# Patient Record
Sex: Female | Born: 1975 | Race: Black or African American | Hispanic: No | Marital: Single | Smoking: Never smoker
Health system: Southern US, Community
[De-identification: ages and names within clinical notes are randomized; demographics above are authoritative.]

## PROBLEM LIST (undated history)

## (undated) DIAGNOSIS — H409 Unspecified glaucoma: Secondary | ICD-10-CM

## (undated) DIAGNOSIS — G4730 Sleep apnea, unspecified: Secondary | ICD-10-CM

## (undated) DIAGNOSIS — R32 Unspecified urinary incontinence: Secondary | ICD-10-CM

## (undated) DIAGNOSIS — F32A Depression, unspecified: Secondary | ICD-10-CM

## (undated) DIAGNOSIS — R519 Headache, unspecified: Secondary | ICD-10-CM

## (undated) DIAGNOSIS — B019 Varicella without complication: Secondary | ICD-10-CM

## (undated) DIAGNOSIS — G35 Multiple sclerosis: Secondary | ICD-10-CM

## (undated) HISTORY — DX: Varicella without complication: B01.9

## (undated) HISTORY — PX: PARTIAL HYSTERECTOMY: SHX80

## (undated) HISTORY — DX: Sleep apnea, unspecified: G47.30

## (undated) HISTORY — DX: Headache, unspecified: R51.9

## (undated) HISTORY — DX: Unspecified urinary incontinence: R32

## (undated) HISTORY — DX: Depression, unspecified: F32.A

## (undated) HISTORY — DX: Multiple sclerosis: G35

## (undated) HISTORY — DX: Unspecified glaucoma: H40.9

## (undated) HISTORY — PX: UTERINE FIBROID SURGERY: SHX826

---

## 2016-02-28 DIAGNOSIS — D649 Anemia, unspecified: Secondary | ICD-10-CM | POA: Insufficient documentation

## 2016-02-28 HISTORY — DX: Anemia, unspecified: D64.9

## 2021-02-15 ENCOUNTER — Ambulatory Visit (INDEPENDENT_AMBULATORY_CARE_PROVIDER_SITE_OTHER): Payer: 59 | Admitting: Adult Health

## 2021-02-15 ENCOUNTER — Other Ambulatory Visit: Payer: Self-pay

## 2021-02-15 ENCOUNTER — Encounter: Payer: Self-pay | Admitting: Adult Health

## 2021-02-15 VITALS — BP 124/82 | HR 88 | Temp 97.7°F | Ht 63.39 in | Wt 298.4 lb

## 2021-02-15 DIAGNOSIS — D509 Iron deficiency anemia, unspecified: Secondary | ICD-10-CM

## 2021-02-15 DIAGNOSIS — Z6841 Body Mass Index (BMI) 40.0 and over, adult: Secondary | ICD-10-CM | POA: Insufficient documentation

## 2021-02-15 DIAGNOSIS — E559 Vitamin D deficiency, unspecified: Secondary | ICD-10-CM | POA: Diagnosis not present

## 2021-02-15 DIAGNOSIS — Z23 Encounter for immunization: Secondary | ICD-10-CM | POA: Insufficient documentation

## 2021-02-15 DIAGNOSIS — Z0000 Encounter for general adult medical examination without abnormal findings: Secondary | ICD-10-CM | POA: Insufficient documentation

## 2021-02-15 DIAGNOSIS — Z1231 Encounter for screening mammogram for malignant neoplasm of breast: Secondary | ICD-10-CM

## 2021-02-15 DIAGNOSIS — R5383 Other fatigue: Secondary | ICD-10-CM | POA: Diagnosis not present

## 2021-02-15 LAB — COMPREHENSIVE METABOLIC PANEL
ALT: 8 U/L (ref 0–35)
AST: 10 U/L (ref 0–37)
Albumin: 4 g/dL (ref 3.5–5.2)
Alkaline Phosphatase: 68 U/L (ref 39–117)
BUN: 10 mg/dL (ref 6–23)
CO2: 27 mEq/L (ref 19–32)
Calcium: 9 mg/dL (ref 8.4–10.5)
Chloride: 105 mEq/L (ref 96–112)
Creatinine, Ser: 0.92 mg/dL (ref 0.40–1.20)
GFR: 74.35 mL/min (ref >60.00)
Glucose, Bld: 109 mg/dL — ABNORMAL HIGH (ref 70–99)
Potassium: 4.7 mEq/L (ref 3.5–5.1)
Sodium: 139 mEq/L (ref 135–145)
Total Bilirubin: 0.6 mg/dL (ref 0.2–1.2)
Total Protein: 6.8 g/dL (ref 6.0–8.3)

## 2021-02-15 LAB — CBC WITH DIFFERENTIAL/PLATELET
Basophils Absolute: 0 10*3/uL (ref 0.0–0.1)
Basophils Relative: 0.3 % (ref 0.0–3.0)
Eosinophils Absolute: 0.1 10*3/uL (ref 0.0–0.7)
Eosinophils Relative: 1.4 % (ref 0.0–5.0)
HCT: 39.1 % (ref 36.0–46.0)
Hemoglobin: 12.8 g/dL (ref 12.0–15.0)
Lymphocytes Relative: 12.6 % (ref 12.0–46.0)
Lymphs Abs: 0.6 10*3/uL — ABNORMAL LOW (ref 0.7–4.0)
MCHC: 32.6 g/dL (ref 30.0–36.0)
MCV: 89.1 fl (ref 78.0–100.0)
Monocytes Absolute: 0.3 10*3/uL (ref 0.1–1.0)
Monocytes Relative: 6.6 % (ref 3.0–12.0)
Neutro Abs: 3.8 10*3/uL (ref 1.4–7.7)
Neutrophils Relative %: 79.1 % — ABNORMAL HIGH (ref 43.0–77.0)
Platelets: 327 10*3/uL (ref 150.0–400.0)
RBC: 4.39 Mil/uL (ref 3.87–5.11)
RDW: 13.9 % (ref 11.5–15.5)
WBC: 4.8 10*3/uL (ref 4.0–10.5)

## 2021-02-15 LAB — TSH: TSH: 2.93 u[IU]/mL (ref 0.35–4.50)

## 2021-02-15 LAB — LIPID PANEL
Cholesterol: 242 mg/dL — ABNORMAL HIGH (ref 0–200)
HDL: 34.7 mg/dL — ABNORMAL LOW (ref >39.00)
LDL Cholesterol: 180 mg/dL — ABNORMAL HIGH (ref 0–99)
NonHDL: 207.3
Total CHOL/HDL Ratio: 7
Triglycerides: 137 mg/dL (ref 0.0–149.0)
VLDL: 27.4 mg/dL (ref 0.0–40.0)

## 2021-02-15 LAB — VITAMIN D 25 HYDROXY (VIT D DEFICIENCY, FRACTURES): VITD: 7.99 ng/mL — ABNORMAL LOW (ref 30.00–100.00)

## 2021-02-15 NOTE — Progress Notes (Signed)
New Patient Office Visit  Subjective:  Patient ID: Shelia Chapman, female    DOB: June 19, 1976  Age: 45 y.o. MRN: [id number]  CC:  Chief Complaint  Patient presents with  . New Patient (Initial Visit)    Previous PCP was Gerarda Fraction at [facility]    HPI Shelia Chapman presents for establishment of care. She feels well. She moved here recently.  She is not up to date for her TDAP vaccine information sheet was given and she will return for this at later date.  She reports she is due for a mammogram and would like this ordered today. She denies any new or changing breast concerns. She reports her last mammogram was over a year ago in [place] and was normal. ' She reports due for a PAP smear no previous.   She has never had colonoscopy, she is age 69 dicussed new guidelines age 83 and she will consider this and check with her insurance. She denies any rectal pain, pressure, bleeding or dark colored stools. Denies any blood loss.   Eye exam complete, has glasses.   She is on iron supplement for history of iron deficiency anemia and is taking.  No recent labs per patient.  Patient  denies any fever, body aches,chills, rash, chest pain, shortness of breath, nausea, vomiting, or diarrhea.  Denies dizziness, lightheadedness, pre syncopal or syncopal episodes.    Patient's last menstrual period was 01/21/2021.   Past Medical History:  Diagnosis Date  . Chicken pox   . Depression   . Frequent headaches   . Glaucoma   . Urine incontinence     Past Surgical History:  Procedure Laterality Date  . ABDOMINAL HYSTERECTOMY  2019    Family History  Problem Relation Age of Onset  . Cancer Mother   . Depression Mother   . Diabetes Mother   . Hyperlipidemia Mother   . Hypertension Mother   . Diabetes Sister   . Hypertension Sister     Social History   Socioeconomic History  . Marital status: Single    Spouse name: Not on file  . Number of children: Not  on file  . Years of education: Not on file  . Highest education level: Not on file  Occupational History  . Not on file  Tobacco Use  . Smoking status: Never Smoker  . Smokeless tobacco: Never Used  Substance and Sexual Activity  . Alcohol use: Never  . Drug use: Never  . Sexual activity: Not Currently  Other Topics Concern  . Not on file  Social History Narrative  . Not on file   Social Determinants of Health   Financial Resource Strain: Not on file  Food Insecurity: Not on file  Transportation Needs: Not on file  Physical Activity: Not on file  Stress: Not on file  Social Connections: Not on file  Intimate Partner Violence: Not on file    ROS Review of Systems  Constitutional: Positive for fatigue.  HENT: Negative.   Respiratory: Negative.   Cardiovascular: Negative.   Gastrointestinal: Negative.   Genitourinary: Negative.   Musculoskeletal: Negative.   Skin: Negative.   Neurological: Negative.   Hematological: Negative.   Psychiatric/Behavioral: Negative.     Objective:   Today's Vitals: BP 124/82 (BP Location: Left Arm, Patient Position: Sitting)   Pulse 88   Temp 97.7 F (36.5 C)   Ht 5' 3.39" (1.61 m)   Wt 298 lb 6.4 oz (135.4 kg)   LMP  01/21/2021   SpO2 98%   BMI 52.22 kg/m   Physical Exam Vitals reviewed.  Constitutional:      General: She is not in acute distress.    Appearance: She is well-developed. She is obese. She is not diaphoretic.     Interventions: She is not intubated.    Comments: Patient appers well, not sickly. Speaking in complete sentences. Patient moves on and off of exam table and in room without difficulty. Gait is normal in hall and in room. Patient is oriented to person place time and situation. Patient answers questions appropriately and engages eye contact and verbal dialect with provider.   HENT:     Head: Normocephalic and atraumatic.     Right Ear: External ear normal.     Left Ear: External ear normal.     Nose:  Nose normal.     Mouth/Throat:     Pharynx: No oropharyngeal exudate.  Eyes:     General: Lids are normal. No scleral icterus.       Right eye: No discharge.        Left eye: No discharge.     Conjunctiva/sclera: Conjunctivae normal.     Right eye: Right conjunctiva is not injected. No exudate or hemorrhage.    Left eye: Left conjunctiva is not injected. No exudate or hemorrhage.    Pupils: Pupils are equal, round, and reactive to light.  Neck:     Thyroid: No thyroid mass or thyromegaly.     Vascular: Normal carotid pulses. No carotid bruit, hepatojugular reflux or JVD.     Trachea: Trachea and phonation normal. No tracheal tenderness or tracheal deviation.     Meningeal: Brudzinski's sign and Kernig's sign absent.  Cardiovascular:     Rate and Rhythm: Normal rate and regular rhythm.     Pulses: Normal pulses.          Radial pulses are 2+ on the right side and 2+ on the left side.       Dorsalis pedis pulses are 2+ on the right side and 2+ on the left side.       Posterior tibial pulses are 2+ on the right side and 2+ on the left side.     Heart sounds: Normal heart sounds, S1 normal and S2 normal. Heart sounds not distant. No murmur heard. No friction rub. No gallop.   Pulmonary:     Effort: Pulmonary effort is normal. No tachypnea, bradypnea, accessory muscle usage or respiratory distress. She is not intubated.     Breath sounds: Normal breath sounds. No stridor. No wheezing, rhonchi or rales.  Chest:     Chest wall: No tenderness.  Breasts:     Right: No supraclavicular adenopathy.     Left: No supraclavicular adenopathy.    Abdominal:     General: Bowel sounds are normal. There is no distension or abdominal bruit.     Palpations: Abdomen is soft. There is no shifting dullness, fluid wave, hepatomegaly, splenomegaly, mass or pulsatile mass.     Tenderness: There is no abdominal tenderness. There is no right CVA tenderness, left CVA tenderness, guarding or rebound.      Hernia: No hernia is present.  Musculoskeletal:        General: No tenderness or deformity. Normal range of motion.     Cervical back: Full passive range of motion without pain, normal range of motion and neck supple. No edema, erythema or rigidity. No spinous process tenderness or muscular tenderness. Normal range  of motion.     Right lower leg: No edema.     Left lower leg: No edema.  Lymphadenopathy:     Head:     Right side of head: No submental, submandibular, tonsillar, preauricular, posterior auricular or occipital adenopathy.     Left side of head: No submental, submandibular, tonsillar, preauricular, posterior auricular or occipital adenopathy.     Cervical: No cervical adenopathy.     Right cervical: No superficial, deep or posterior cervical adenopathy.    Left cervical: No superficial, deep or posterior cervical adenopathy.     Upper Body:     Right upper body: No supraclavicular or pectoral adenopathy.     Left upper body: No supraclavicular or pectoral adenopathy.  Skin:    General: Skin is warm and dry.     Coloration: Skin is not pale.     Findings: No abrasion, bruising, burn, ecchymosis, erythema, lesion, petechiae or rash.     Nails: There is no clubbing.  Neurological:     Mental Status: She is alert and oriented to person, place, and time.     GCS: GCS eye subscore is 4. GCS verbal subscore is 5. GCS motor subscore is 6.     Cranial Nerves: No cranial nerve deficit.     Sensory: No sensory deficit.     Motor: No weakness, tremor, atrophy, abnormal muscle tone or seizure activity.     Coordination: Coordination normal.     Gait: Gait normal.     Deep Tendon Reflexes: Reflexes are normal and symmetric. Reflexes normal. Babinski sign absent on the right side. Babinski sign absent on the left side.     Reflex Scores:      Tricep reflexes are 2+ on the right side and 2+ on the left side.      Bicep reflexes are 2+ on the right side and 2+ on the left side.       Brachioradialis reflexes are 2+ on the right side and 2+ on the left side.      Patellar reflexes are 2+ on the right side and 2+ on the left side.      Achilles reflexes are 2+ on the right side and 2+ on the left side. Psychiatric:        Mood and Affect: Mood normal.        Speech: Speech normal.        Behavior: Behavior normal.        Thought Content: Thought content normal.        Judgment: Judgment normal.     Assessment & Plan:   Problem List Items Addressed This Visit      Other   Vitamin D deficiency   Relevant Orders   VITAMIN D 25 Hydroxy (Vit-D Deficiency, Fractures)   Need for diphtheria-tetanus-pertussis (Tdap) vaccine   Screening mammogram for breast cancer   Relevant Orders   MM Digital Screening   Iron deficiency anemia   Relevant Medications   FEROSUL 325 (65 Fe) MG tablet   Other Relevant Orders   Iron, TIBC and Ferritin Panel   Fatigue   Relevant Orders   CBC with Differential/Platelet   Comprehensive metabolic panel   TSH   Lipid panel   Class 3 severe obesity without serious comorbidity with body mass index (BMI) of 50.0 to 59.9 in adult [facility]) - Primary     Previous records requested.   1. Class 3 severe obesity without serious comorbidity with body mass index (BMI)  of 50.0 to 59.9 in adult, unspecified obesity type ([place]) The patient is advised to begin progressive daily aerobic exercise program, follow a low fat, low cholesterol diet, attempt to lose weight, reduce salt in diet and cooking, reduce exposure to stress, improve dietary compliance, continue current medications, continue current healthy lifestyle patterns and return for routine annual checkups.  Return for PAP and yearly physical   2. Vitamin D deficiency  - VITAMIN D 25 Hydroxy (Vit-D Deficiency, Fractures)  3. Screening mammogram for breast cancer Call [facility] to schedule, she has an appointment.  - MM Digital Screening  4. Need for diphtheria-tetanus-pertussis (Tdap)  vaccine She will review vaccine administration sheet and return to office for this, not given today. Denies any vaccine reaction in past.    5. Iron deficiency anemia, unspecified iron deficiency anemia type- history of and is taking iron supplement.  Taking iron see medication list.   - Iron, TIBC and Ferritin Panel  6. Fatigue, unspecified type Labs today were ordered.  - CBC with Differential/Platelet - Comprehensive metabolic panel - TSH - Lipid panel  Orders Placed This Encounter  Procedures  . MM Digital Screening  . CBC with Differential/Platelet  . Comprehensive metabolic panel  . TSH  . Lipid panel  . VITAMIN D 25 Hydroxy (Vit-D Deficiency, Fractures)  . Iron, TIBC and Ferritin Panel   Outpatient Encounter Medications as of 02/15/2021  Medication Sig  . CRYSELLE-28 0.3-30 MG-MCG tablet Take 1 tablet by mouth daily.  . FEROSUL 325 (65 Fe) MG tablet Take 325 mg by mouth daily.  Marland Kitchen GILENYA 0.5 MG CAPS Take 1 capsule by mouth daily.  Marland Kitchen LUMIGAN 0.01 % SOLN 1 drop at bedtime.   No facility-administered encounter medications on file as of 02/15/2021.    Return precautions given. Red Flags discussed. The patient was given clear instructions to go to ER or return to medical center if any red flags develop, symptoms do not improve, worsen or new problems develop. They verbalized understanding.    Risks, benefits, and alternatives of the medications and treatment plan prescribed today were discussed, and patient expressed understanding.    Education regarding symptom management and diagnosis given to patient on AVS.  Patient was in agreement with treatment plan.   Continue to follow with  Kelby Aline. Juliani Laduke AGNP-C, FNP-C for routine health maintenance.   Kelby Aline. Xareni Kelch AGNP-C, FNP-C Follow-up: Return if symptoms worsen or fail to improve, for at any time for any worsening symptoms.   Marcille Buffy, FNP

## 2021-02-15 NOTE — Patient Instructions (Addendum)
Call to schedule your screening mammogram. Your orders have been placed for your exam.  Let our office know if you have questions, concerns, or any difficulty scheduling.  If normal results then yearly screening mammograms are recommended unless you notice  Changes in your breast then you should schedule a follow up office visit. If abnormal results  Further imaging will be warranted and sooner follow up as determined by the radiologist at the [facility].   [facility] [address], [place] [address]  Main: [phone]   Health Maintenance, Female Adopting a healthy lifestyle and getting preventive care are important in promoting health and wellness. Ask your health care provider about:  The right schedule for you to have regular tests and exams.  Things you can do on your own to prevent diseases and keep yourself healthy. What should I know about diet, weight, and exercise? Eat a healthy diet  Eat a diet that includes plenty of vegetables, fruits, low-fat dairy products, and lean protein.  Do not eat a lot of foods that are high in solid fats, added sugars, or sodium.   Maintain a healthy weight Body mass index (BMI) is used to identify weight problems. It estimates body fat based on height and weight. Your health care provider can help determine your BMI and help you achieve or maintain a healthy weight. Get regular exercise Get regular exercise. This is one of the most important things you can do for your health. Most adults should:  Exercise for at least 150 minutes each week. The exercise should increase your heart rate and make you sweat (moderate-intensity exercise).  Do strengthening exercises at least twice a week. This is in addition to the moderate-intensity exercise.  Spend less time sitting. Even light physical activity can be beneficial. Watch cholesterol and blood lipids Have your blood tested for lipids and  cholesterol at 47 years of age, then have this test every 5 years. Have your cholesterol levels checked more often if:  Your lipid or cholesterol levels are high.  You are older than 47 years of age.  You are at high risk for heart disease. What should I know about cancer screening? Depending on your health history and family history, you may need to have cancer screening at various ages. This may include screening for:  Breast cancer.  Cervical cancer.  Colorectal cancer.  Skin cancer.  Lung cancer. What should I know about heart disease, diabetes, and high blood pressure? Blood pressure and heart disease  High blood pressure causes heart disease and increases the risk of stroke. This is more likely to develop in people who have high blood pressure readings, are of African descent, or are overweight.  Have your blood pressure checked: ? Every 3-5 years if you are 40-55 years of age. ? Every year if you are 37 years old or older. Diabetes Have regular diabetes screenings. This checks your fasting blood sugar level. Have the screening done:  Once every three years after age 27 if you are at a normal weight and have a low risk for diabetes.  More often and at a younger age if you are overweight or have a high risk for diabetes. What should I know about preventing infection? Hepatitis B If you have a higher risk for hepatitis B, you should be screened for this virus. Talk with your health care provider to find out if you are at risk for hepatitis B infection. Hepatitis C Testing  is recommended for:  Everyone born from [birth year] through 1967.  Anyone with known risk factors for hepatitis C. Sexually transmitted infections (STIs)  Get screened for STIs, including gonorrhea and chlamydia, if: ? You are sexually active and are younger than 47 years of age. ? You are older than 47 years of age and your health care provider tells you that you are at risk for this type of  infection. ? Your sexual activity has changed since you were last screened, and you are at increased risk for chlamydia or gonorrhea. Ask your health care provider if you are at risk.  Ask your health care provider about whether you are at high risk for HIV. Your health care provider may recommend a prescription medicine to help prevent HIV infection. If you choose to take medicine to prevent HIV, you should first get tested for HIV. You should then be tested every 3 months for as long as you are taking the medicine. Pregnancy  If you are about to stop having your period (premenopausal) and you may become pregnant, seek counseling before you get pregnant.  Take 400 to 800 micrograms (mcg) of folic acid every day if you become pregnant.  Ask for birth control (contraception) if you want to prevent pregnancy. Osteoporosis and menopause Osteoporosis is a disease in which the bones lose minerals and strength with aging. This can result in bone fractures. If you are 13 years old or older, or if you are at risk for osteoporosis and fractures, ask your health care provider if you should:  Be screened for bone loss.  Take a calcium or vitamin D supplement to lower your risk of fractures.  Be given hormone replacement therapy (HRT) to treat symptoms of menopause. Follow these instructions at home: Lifestyle  Do not use any products that contain nicotine or tobacco, such as cigarettes, e-cigarettes, and chewing tobacco. If you need help quitting, ask your health care provider.  Do not use street drugs.  Do not share needles.  Ask your health care provider for help if you need support or information about quitting drugs. Alcohol use  Do not drink alcohol if: ? Your health care provider tells you not to drink. ? You are pregnant, may be pregnant, or are planning to become pregnant.  If you drink alcohol: ? Limit how much you use to 0-1 drink a day. ? Limit intake if you are  breastfeeding.  Be aware of how much alcohol is in your drink. In the [place] one drink equals one 12 oz bottle of beer (355 mL), one 5 oz glass of wine (148 mL), or one 1 oz glass of hard liquor (44 mL). General instructions  Schedule regular health, dental, and eye exams.  Stay current with your vaccines.  Tell your health care provider if: ? You often feel depressed. ? You have ever been abused or do not feel safe at home ?  ?  ?  ?  ?  ? . Summary  Adopting a healthy lifestyle and getting preventive care are important in promoting health and wellness.  Follow your health care provider's instructions about healthy diet, exercising, and getting tested or screened for diseases.  Follow your health care provider's instructions on monitoring your cholesterol and blood pressure. This information is not intended to replace advice given to you by your health care provider. Make sure you discuss any questions you have with your health care provider. Document Revised: 09/23/2018 Document Reviewed: 09/23/2018 Elsevier Patient Education  2021  [place]. Tdap (Tetanus, Diphtheria, Pertussis) Vaccine: What You Need to Know 1. Why get vaccinated? Tdap vaccine can prevent tetanus, diphtheria, and pertussis. Diphtheria and pertussis spread from person to person. Tetanus enters the body through cuts or wounds.  TETANUS (T) causes painful stiffening of the muscles. Tetanus can lead to serious health problems, including being unable to open the mouth, having trouble swallowing and breathing, or death.  DIPHTHERIA (D) can lead to difficulty breathing, heart failure, paralysis, or death.  PERTUSSIS (aP), also known as "whooping cough," can cause uncontrollable, violent coughing that makes it hard to breathe, eat, or drink. Pertussis can be extremely serious especially in babies and young children, causing pneumonia, convulsions, brain damage, or death. In teens and adults, it can cause weight  loss, loss of bladder control, passing out, and rib fractures from severe coughing. 2. Tdap vaccine Tdap is only for children 7 years and older, adolescents, and adults.  Adolescents should receive a single dose of Tdap, preferably at age 85 or 5 years. Pregnant people should get a dose of Tdap during every pregnancy, preferably during the early part of the third trimester, to help protect the newborn from pertussis. Infants are most at risk for severe, life-threatening complications from pertussis. Adults who have never received Tdap should get a dose of Tdap. Also, adults should receive a booster dose of either Tdap or Td (a different vaccine that protects against tetanus and diphtheria but not pertussis) every 10 years, or after 5 years in the case of a severe or dirty wound or burn. Tdap may be given at the same time as other vaccines. 3. Talk with your health care provider Tell your vaccine provider if the person getting the vaccine:  Has had an allergic reaction after a previous dose of any vaccine that protects against tetanus, diphtheria, or pertussis, or has any severe, life-threatening allergies  Has had a coma, decreased level of consciousness, or prolonged seizures within 7 days after a previous dose of any pertussis vaccine (DTP, DTaP, or Tdap)  Has seizures or another nervous system problem  Has ever had Guillain-Barr Syndrome (also called "GBS")  Has had severe pain or swelling after a previous dose of any vaccine that protects against tetanus or diphtheria In some cases, your health care provider may decide to postpone Tdap vaccination until a future visit. People with minor illnesses, such as a cold, may be vaccinated. People who are moderately or severely ill should usually wait until they recover before getting Tdap vaccine.  Your health care provider can give you more information. 4. Risks of a vaccine reaction  Pain, redness, or swelling where the shot was given, mild  fever, headache, feeling tired, and nausea, vomiting, diarrhea, or stomachache sometimes happen after Tdap vaccination. People sometimes faint after medical procedures, including vaccination. Tell your provider if you feel dizzy or have vision changes or ringing in the ears.  As with any medicine, there is a very remote chance of a vaccine causing a severe allergic reaction, other serious injury, or death. 5. What if there is a serious problem? An allergic reaction could occur after the vaccinated person leaves the clinic. If you see signs of a severe allergic reaction (hives, swelling of the face and throat, difficulty breathing, a fast heartbeat, dizziness, or weakness), call 9-1-1 and get the person to the nearest hospital. For other signs that concern you, call your health care provider.  Adverse reactions should be reported to the Vaccine Adverse Event Reporting System (  VAERS). Your health care provider will usually file this report, or you can do it yourself. Visit the VAERS website at www.vaers.SamedayNews.es or call 434-824-7772. VAERS is only for reporting reactions, and VAERS staff members do not give medical advice. 6. The National Vaccine Injury Compensation Program The [employer] Vaccine Injury Compensation Program (VICP) is a federal program that was created to compensate people who may have been injured by certain vaccines. Claims regarding alleged injury or death due to vaccination have a time limit for filing, which may be as short as two years. Visit the VICP website at GoldCloset.com.ee or call 661-783-6662 to learn about the program and about filing a claim. 7. How can I learn more?  Ask your health care provider.  Call your local or state health department.  Visit the website of the Food and Drug Administration (FDA) for vaccine package inserts and additional information at TraderRating.uy.  Contact the Centers for Disease Control and  Prevention (CDC): ? Call 762 195 4899 (1-800-CDC-INFO) or ? Visit CDC's website at http://hunter.com/. Vaccine Information Statement Tdap (Tetanus, Diphtheria, Pertussis) Vaccine (05/19/2020) This information is not intended to replace advice given to you by your health care provider. Make sure you discuss any questions you have with your health care provider. Document Revised: 06/14/2020 Document Reviewed: 06/14/2020 Elsevier Patient Education  2021 [employer].

## 2021-02-16 LAB — IRON,TIBC AND FERRITIN PANEL
%SAT: 19 % (calc) (ref 16–45)
Ferritin: 15 ng/mL — ABNORMAL LOW (ref 16–232)
Iron: 71 ug/dL (ref 40–190)
TIBC: 365 mcg/dL (calc) (ref 250–450)

## 2021-02-20 ENCOUNTER — Other Ambulatory Visit: Payer: Self-pay | Admitting: Adult Health

## 2021-02-20 DIAGNOSIS — E559 Vitamin D deficiency, unspecified: Secondary | ICD-10-CM

## 2021-02-20 DIAGNOSIS — Z1231 Encounter for screening mammogram for malignant neoplasm of breast: Secondary | ICD-10-CM

## 2021-02-20 MED ORDER — VITAMIN D (ERGOCALCIFEROL) 1.25 MG (50000 UNIT) PO CAPS: 50000.0000 [IU] | ORAL_CAPSULE | ORAL | 0 refills | Status: DC

## 2021-02-20 NOTE — Progress Notes (Signed)
CBC, CMP, and Iron TIBC ferritin within normal variant ranges. Glucose is elevated ,monitor diet and exercise to prevent diabetes in future.  Total cholesterol and LDL elevated.  Discuss lifestyle modification with patient e.g. increase exercise, fiber, fruits, vegetables, lean meat, and omega 3/fish intake and decrease saturated fat.  If patient following strict diet and exercise program already please schedule follow up appointment with primary care physician  Vitamin  D is very low, this can contribute to poor sleep and fatigue, will send in prescription for Vitamin D at 50,000 units by mouth once every 7 days/(once weekly) for 12 weeks. Advise recheck lab Vitamin D in 1-2 weeks after completing vitamin d prescription. Labs need to be scheduled.   TSH for thyroid is within normal limits.

## 2021-02-21 ENCOUNTER — Other Ambulatory Visit: Payer: Self-pay | Admitting: Adult Health

## 2021-02-21 ENCOUNTER — Encounter: Payer: Self-pay | Admitting: Adult Health

## 2021-02-21 DIAGNOSIS — D649 Anemia, unspecified: Secondary | ICD-10-CM

## 2021-02-21 NOTE — Progress Notes (Signed)
Orders Placed This Encounter  Procedures  . Ambulatory referral to Hematology / Oncology  Anemia of unknown etiology - Plan: Ambulatory referral to Hematology / Oncology   Previously seen in [place] per chart review records seen at hematology/ oncology in [place]. [phone].

## 2021-02-28 ENCOUNTER — Other Ambulatory Visit: Payer: Self-pay

## 2021-02-28 ENCOUNTER — Inpatient Hospital Stay
Admission: RE | Admit: 2021-02-28 | Discharge: 2021-02-28 | Disposition: A | Discharge status: Home / Self Care | Payer: Self-pay | Source: Ambulatory Visit | Attending: *Deleted | Admitting: *Deleted

## 2021-02-28 ENCOUNTER — Ambulatory Visit
Admission: RE | Admit: 2021-02-28 | Discharge: 2021-02-28 | Disposition: A | Discharge status: Home / Self Care | Payer: 59 | Source: Ambulatory Visit | Attending: Adult Health | Admitting: Adult Health

## 2021-02-28 ENCOUNTER — Other Ambulatory Visit: Payer: Self-pay | Admitting: *Deleted

## 2021-02-28 DIAGNOSIS — Z1231 Encounter for screening mammogram for malignant neoplasm of breast: Secondary | ICD-10-CM

## 2021-02-28 IMAGING — MG MM DIGITAL SCREENING BILAT W/ TOMO AND CAD
8 of 15 series · 8 of 40 positions shown · non-contrast
Comparison: Previous exam(s).

ACR Breast Density Category a: The breast tissue is almost entirely
fatty.

CLINICAL DATA: Screening.

EXAM:
DIGITAL SCREENING BILATERAL MAMMOGRAM WITH TOMOSYNTHESIS AND CAD
TECHNIQUE: Bilateral screening digital craniocaudal and mediolateral oblique
mammograms were obtained. Bilateral screening digital breast
tomosynthesis was performed. The images were evaluated with
computer-aided detection.

[R CV synth-2D]
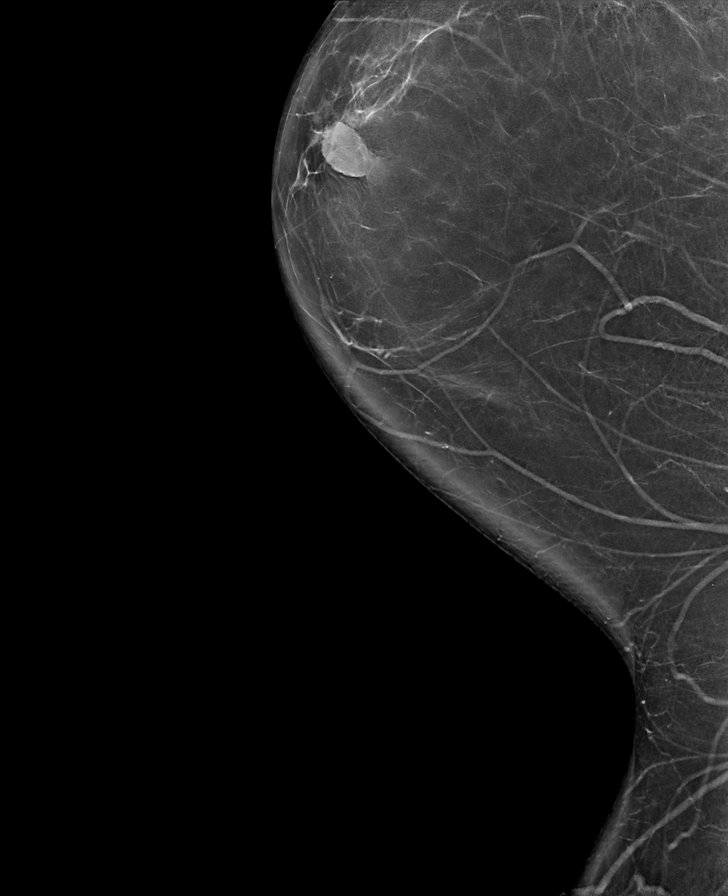

[R MLO synth-2D]
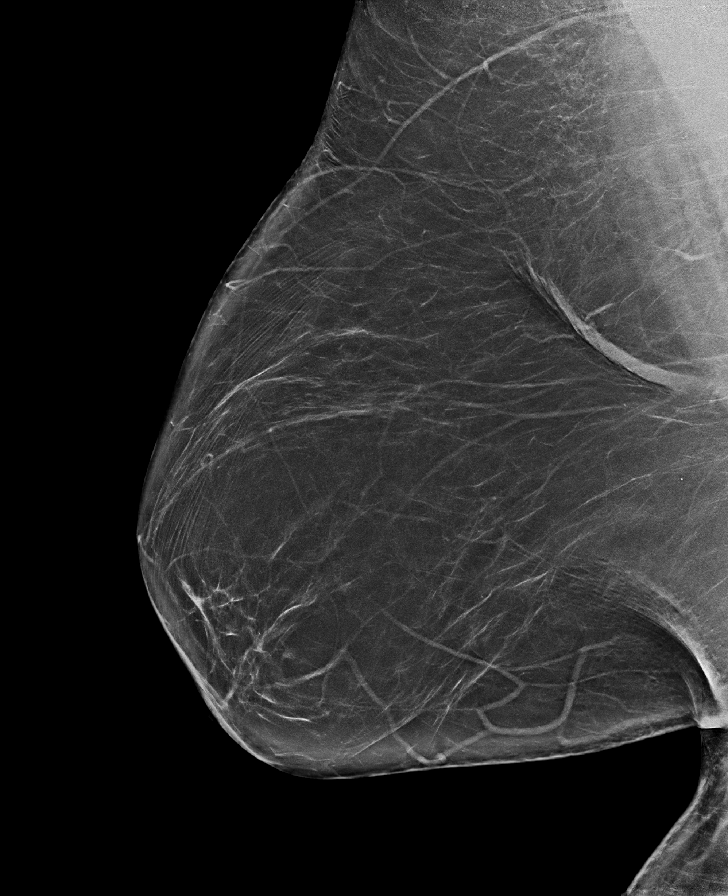

[L MLO synth-2D (1 of 2)]
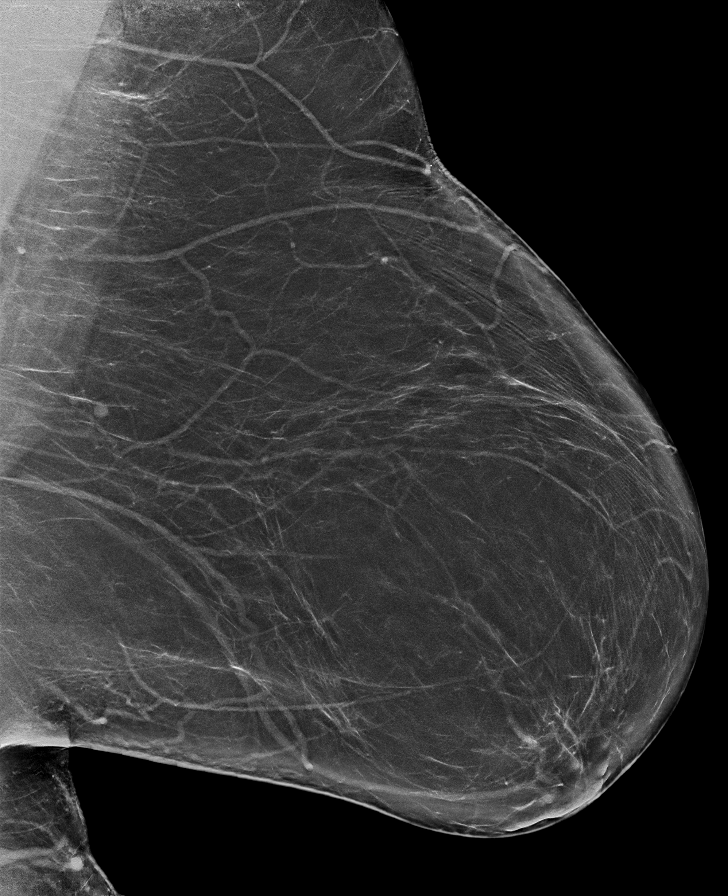

[L CV synth-2D]
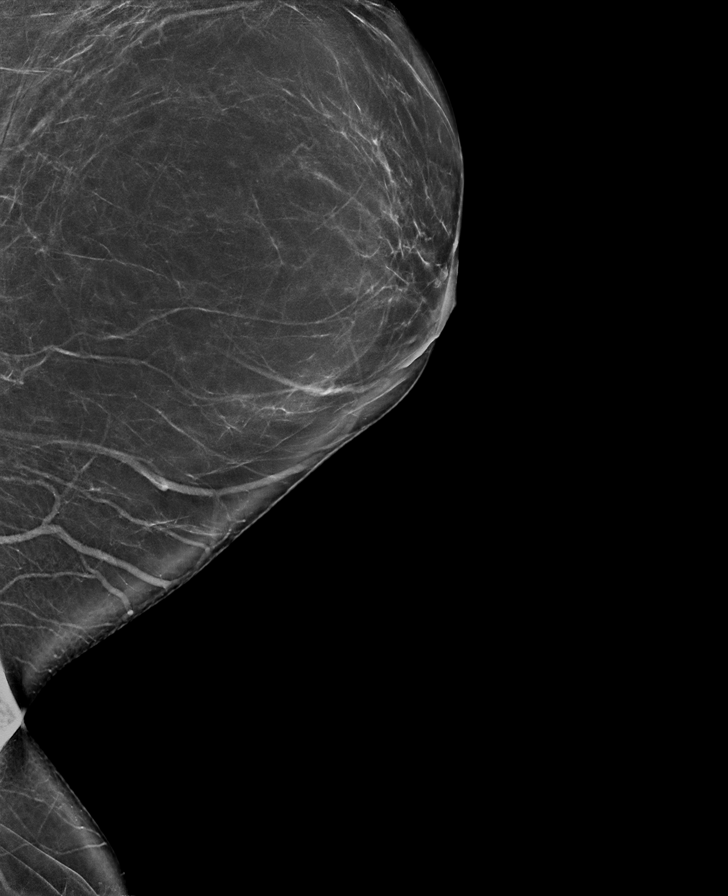

[L CC synth-2D]
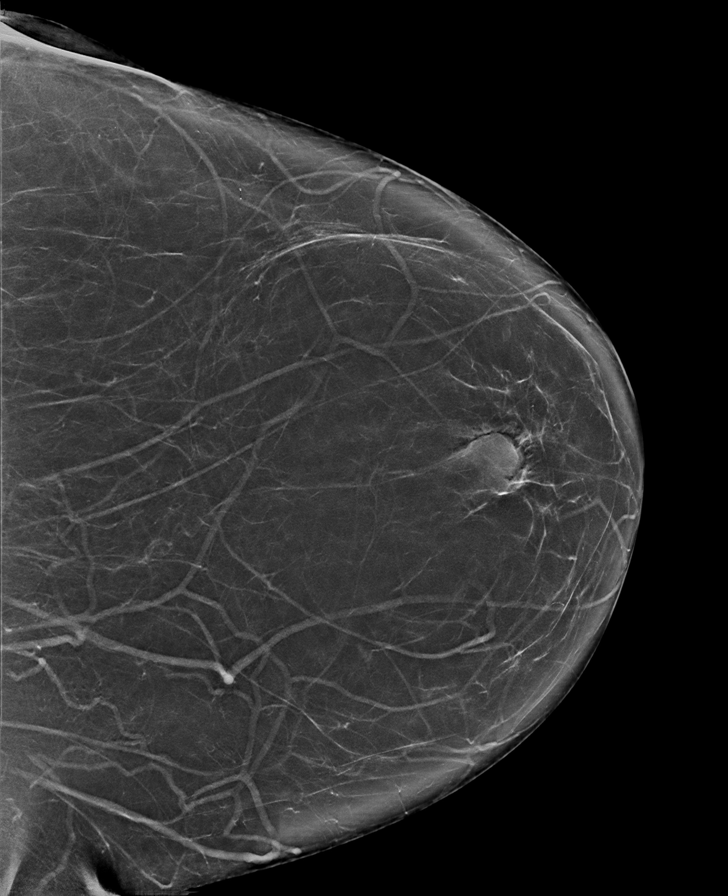

[L MLO synth-2D (2 of 2)]
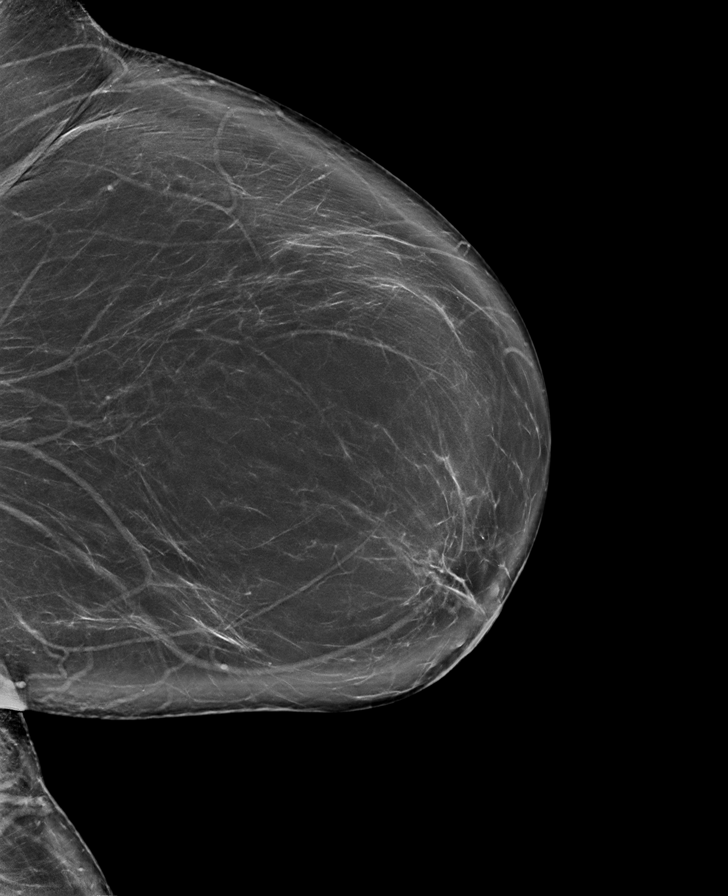

[R CC synth-2D]
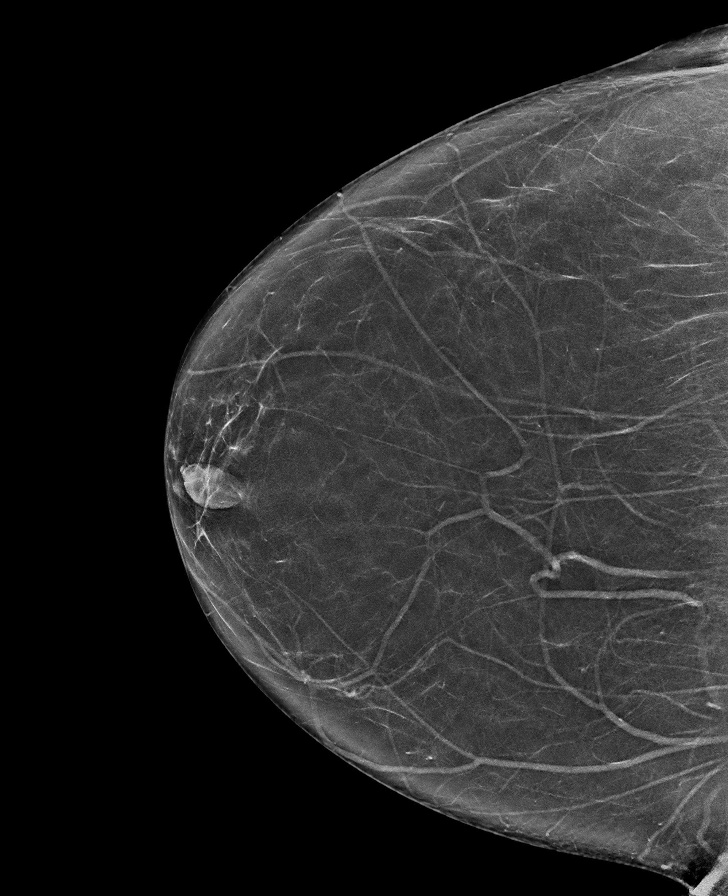

[R CV tomo · tomo slice 61/89.0]
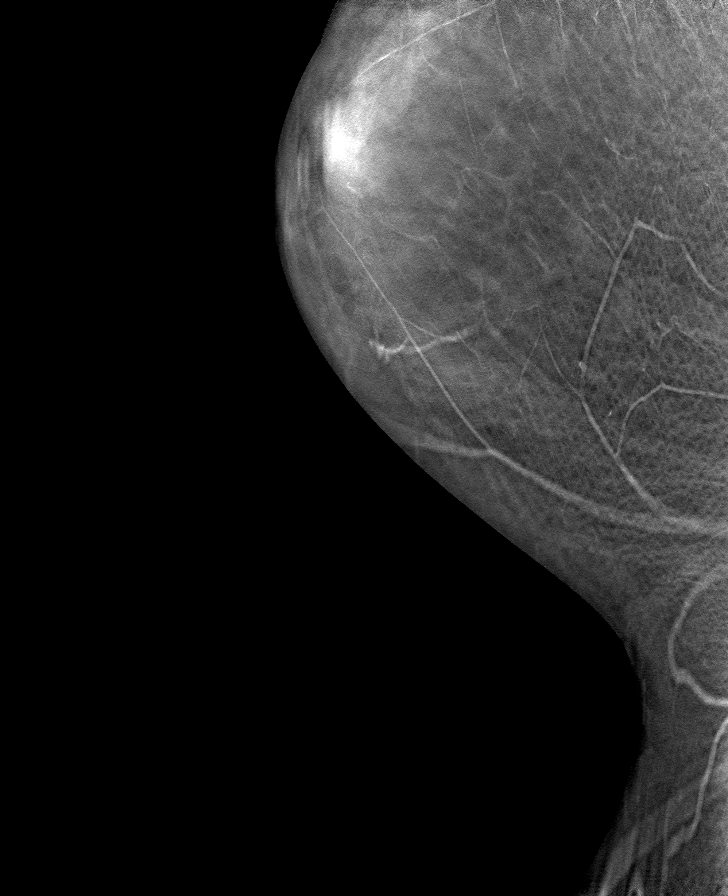

[8 of 40 positions shown; findings below may reference images not displayed]

FINDINGS: There are no findings suspicious for malignancy. The images were
evaluated with computer-aided detection.
IMPRESSION: No mammographic evidence of malignancy. A result letter of this
screening mammogram will be mailed directly to the patient.

RECOMMENDATION:
Screening mammogram in one year. (Code:[8E])

BI-RADS CATEGORY  1: Negative.

## 2021-02-28 NOTE — Progress Notes (Signed)
IMPRESSION: No mammographic evidence of malignancy. A result letter of this screening mammogram will be mailed directly to the patient.  RECOMMENDATION: Screening mammogram in one year. (Code:SM-B-01Y)  BI-RADS CATEGORY  1: Negative.   Electronically Signed   By: Marin Olp M.D.   On: 02/28/2021 14:12  Mammogram repeat in one year unless clinically indicated sooner.

## 2021-03-02 ENCOUNTER — Encounter: Payer: Self-pay | Admitting: Oncology

## 2021-03-02 ENCOUNTER — Inpatient Hospital Stay: Payer: 59 | Attending: Oncology | Admitting: Oncology

## 2021-03-02 ENCOUNTER — Telehealth: Payer: Self-pay | Admitting: *Deleted

## 2021-03-02 ENCOUNTER — Inpatient Hospital Stay: Payer: 59

## 2021-03-02 VITALS — BP 134/83 | HR 93 | Temp 97.2°F | Resp 16 | Wt 294.9 lb

## 2021-03-02 DIAGNOSIS — E538 Deficiency of other specified B group vitamins: Secondary | ICD-10-CM | POA: Diagnosis not present

## 2021-03-02 DIAGNOSIS — E611 Iron deficiency: Secondary | ICD-10-CM | POA: Insufficient documentation

## 2021-03-02 DIAGNOSIS — D72810 Lymphocytopenia: Secondary | ICD-10-CM | POA: Diagnosis present

## 2021-03-02 DIAGNOSIS — D649 Anemia, unspecified: Secondary | ICD-10-CM

## 2021-03-02 DIAGNOSIS — Z8049 Family history of malignant neoplasm of other genital organs: Secondary | ICD-10-CM | POA: Insufficient documentation

## 2021-03-02 DIAGNOSIS — Z90710 Acquired absence of both cervix and uterus: Secondary | ICD-10-CM | POA: Diagnosis not present

## 2021-03-02 LAB — CBC WITH DIFFERENTIAL/PLATELET
Abs Immature Granulocytes: 0.02 10*3/uL (ref 0.00–0.07)
Basophils Absolute: 0 10*3/uL (ref 0.0–0.1)
Basophils Relative: 0 %
Eosinophils Absolute: 0.1 10*3/uL (ref 0.0–0.5)
Eosinophils Relative: 1 %
HCT: 39.4 % (ref 36.0–46.0)
Hemoglobin: 12.7 g/dL (ref 12.0–15.0)
Immature Granulocytes: 0 %
Lymphocytes Relative: 9 %
Lymphs Abs: 0.6 10*3/uL — ABNORMAL LOW (ref 0.7–4.0)
MCH: 28.6 pg (ref 26.0–34.0)
MCHC: 32.2 g/dL (ref 30.0–36.0)
MCV: 88.7 fL (ref 80.0–100.0)
Monocytes Absolute: 0.5 10*3/uL (ref 0.1–1.0)
Monocytes Relative: 8 %
Neutro Abs: 5.6 10*3/uL (ref 1.7–7.7)
Neutrophils Relative %: 82 %
Platelets: 339 10*3/uL (ref 150–400)
RBC: 4.44 MIL/uL (ref 3.87–5.11)
RDW: 12.9 % (ref 11.5–15.5)
WBC: 6.8 10*3/uL (ref 4.0–10.5)
nRBC: 0 % (ref 0.0–0.2)

## 2021-03-02 LAB — VITAMIN B12: Vitamin B-12: 170 pg/mL — ABNORMAL LOW (ref 180–914)

## 2021-03-02 LAB — TECHNOLOGIST SMEAR REVIEW: Plt Morphology: ADEQUATE

## 2021-03-02 LAB — RETIC PANEL
Immature Retic Fract: 17.8 % — ABNORMAL HIGH (ref 2.3–15.9)
RBC.: 4.41 MIL/uL (ref 3.87–5.11)
Retic Count, Absolute: 100.5 10*3/uL (ref 19.0–186.0)
Retic Ct Pct: 2.3 % (ref 0.4–3.1)
Reticulocyte Hemoglobin: 32.4 pg (ref >27.9)

## 2021-03-02 LAB — LACTATE DEHYDROGENASE: LDH: 121 U/L (ref 98–192)

## 2021-03-02 LAB — FOLATE: Folate: 10.3 ng/mL (ref >5.9)

## 2021-03-02 NOTE — Telephone Encounter (Signed)
Patient called stating that her Iron pill is Ferosul 325 (65 mg) daily

## 2021-03-02 NOTE — Progress Notes (Signed)
New patient evaluation.   

## 2021-03-03 MED ORDER — VITAMIN B-12 1000 MCG PO TABS: 1000.0000 ug | ORAL_TABLET | Freq: Every day | ORAL | 1 refills | Status: DC

## 2021-03-03 NOTE — Progress Notes (Signed)
Hematology/Oncology Consult note [facility] Telephone:([phone] Fax:(336) [phone]   Patient Care Team: [name], Kelby Aline, FNP as PCP - General (Family Medicine)  REFERRING PROVIDER: Sharmon Leyden*  CHIEF COMPLAINTS/REASON FOR VISIT:  Evaluation of anemia  HISTORY OF PRESENTING ILLNESS:  Shelia Chapman is a  45 y.o.  female with PMH listed below who was referred to me for evaluation of anemia Reviewed patient's recent labs that was done.  Labs revealed anemia with hemoglobin of .   Reviewed patient's previous labs ordered by primary care physician's office, patient reports a history of anemia, previously seen by hematologist and was recommended to take iron supplementation.  She moved from [place] to [place] in March 2022 and wants to establish care locally. She had blood work done on 02/15/2021.  Hemoglobin 12.8, normal total white blood cell count with slightly increased neutrophil.  Low lymphocyte 0.6. 02/15/2021 ferritin 15, iron saturation 19, TIBC 365. Patient reports mild chronic fatigue.  Otherwise doing well.  Denies any black or bloody bowel movement. She tolerates iron supplementation except mild constipation.  Review of Systems  Constitutional: Negative for appetite change, chills, fatigue and fever.  HENT:   Negative for hearing loss and voice change.   Eyes: Negative for eye problems.  Respiratory: Negative for chest tightness and cough.   Cardiovascular: Negative for chest pain.  Gastrointestinal: Negative for abdominal distention, abdominal pain and blood in stool.  Endocrine: Negative for hot flashes.  Genitourinary: Negative for difficulty urinating and frequency.   Musculoskeletal: Negative for arthralgias.  Skin: Negative for itching and rash.  Neurological: Negative for extremity weakness.  Hematological: Negative for adenopathy.  Psychiatric/Behavioral: Negative for confusion.     MEDICAL HISTORY:  Past Medical  History:  Diagnosis Date  . Anemia of unknown etiology 02/28/2016  . Chicken pox   . Depression   . Frequent headaches   . Glaucoma   . Multiple sclerosis ([place])   . Urine incontinence     SURGICAL HISTORY: Past Surgical History:  Procedure Laterality Date  . ABDOMINAL HYSTERECTOMY  2019    SOCIAL HISTORY: Social History   Socioeconomic History  . Marital status: Single    Spouse name: Not on file  . Number of children: Not on file  . Years of education: Not on file  . Highest education level: Not on file  Occupational History  . Not on file  Tobacco Use  . Smoking status: Never Smoker  . Smokeless tobacco: Never Used  Substance and Sexual Activity  . Alcohol use: Never  . Drug use: Never  . Sexual activity: Not Currently  Other Topics Concern  . Not on file  Social History Narrative  . Not on file   Social Determinants of Health   Financial Resource Strain: Not on file  Food Insecurity: Not on file  Transportation Needs: Not on file  Physical Activity: Not on file  Stress: Not on file  Social Connections: Not on file  Intimate Partner Violence: Not on file    FAMILY HISTORY: Family History  Problem Relation Age of Onset  . Depression Mother   . Diabetes Mother   . Hyperlipidemia Mother   . Hypertension Mother   . Uterine cancer Mother   . Diabetes Sister   . Hypertension Sister   . Breast cancer Neg Hx     ALLERGIES:  has No Known Allergies.  MEDICATIONS:  Current Outpatient Medications  Medication Sig Dispense Refill  . CRYSELLE-28 0.3-30 MG-MCG tablet Take  1 tablet by mouth daily.    . FEROSUL 325 (65 Fe) MG tablet Take 325 mg by mouth daily.    Marland Kitchen GILENYA 0.5 MG CAPS Take 1 capsule by mouth daily.    Marland Kitchen LUMIGAN 0.01 % SOLN 1 drop at bedtime.    . Vitamin D, Ergocalciferol, (DRISDOL) 1.25 MG (50000 UNIT) CAPS capsule Take 1 capsule (50,000 Units total) by mouth every 7 (seven) days. (taking one tablet per week) scheduled Vitamin D lab in  1-2  weeks after completing prescription. 12 capsule 0   No current facility-administered medications for this visit.     PHYSICAL EXAMINATION: ECOG PERFORMANCE STATUS: 0 - Asymptomatic Vitals:   03/02/21 1126  BP: 134/83  Pulse: 93  Resp: 16  Temp: (!) 97.2 F (36.2 C)   Filed Weights   03/02/21 1126  Weight: 294 lb 14.4 oz (133.8 kg)    Physical Exam Constitutional:      General: She is not in acute distress. HENT:     Head: Normocephalic and atraumatic.  Eyes:     General: No scleral icterus. Cardiovascular:     Rate and Rhythm: Normal rate and regular rhythm.     Heart sounds: Normal heart sounds.  Pulmonary:     Effort: Pulmonary effort is normal. No respiratory distress.     Breath sounds: No wheezing.  Abdominal:     General: Bowel sounds are normal. There is no distension.     Palpations: Abdomen is soft.  Musculoskeletal:        General: No deformity. Normal range of motion.     Cervical back: Normal range of motion and neck supple.  Skin:    General: Skin is warm and dry.     Findings: No erythema or rash.  Neurological:     Mental Status: She is alert and oriented to person, place, and time. Mental status is at baseline.     Cranial Nerves: No cranial nerve deficit.     Coordination: Coordination normal.  Psychiatric:        Mood and Affect: Mood normal.      LABORATORY DATA:  I have reviewed the data as listed Lab Results  Component Value Date   WBC 6.8 03/02/2021   HGB 12.7 03/02/2021   HCT 39.4 03/02/2021   MCV 88.7 03/02/2021   PLT 339 03/02/2021   Recent Labs    02/15/21 0938  NA 139  K 4.7  CL 105  CO2 27  GLUCOSE 109*  BUN 10  CREATININE 0.92  CALCIUM 9.0  PROT 6.8  ALBUMIN 4.0  AST 10  ALT 8  ALKPHOS 68  BILITOT 0.6   Iron/TIBC/Ferritin/ %Sat    Component Value Date/Time   IRON 71 02/15/2021 0938   TIBC 365 02/15/2021 0938   FERRITIN 15 (L) 02/15/2021 0938   IRONPCTSAT 19 02/15/2021 0938        ASSESSMENT &  PLAN:  1. Lymphocytopenia   2. Anemia, unspecified type    Mild iron deficiency without anemia.  Continue oral iron supplementation.   Lymphopenia,  Check B12, folate, smear, flowcytometry.   B12 defiency, recommend patient to take oral B12 1022mcg daily. Rx sent to pharmacy.   Follow up in 3 months.   Orders Placed This Encounter  Procedures  . CBC with Differential/Platelet    Standing Status:   Future    Number of Occurrences:   1    Standing Expiration Date:   03/02/2022  . Technologist smear review  Standing Status:   Future    Number of Occurrences:   1    Standing Expiration Date:   03/02/2022  . Folate    Standing Status:   Future    Number of Occurrences:   1    Standing Expiration Date:   03/02/2022  . Vitamin B12    Standing Status:   Future    Number of Occurrences:   1    Standing Expiration Date:   03/02/2022  . Flow cytometry panel-leukemia/lymphoma work-up    Standing Status:   Future    Number of Occurrences:   1    Standing Expiration Date:   03/02/2022  . Lactate dehydrogenase    Standing Status:   Future    Number of Occurrences:   1    Standing Expiration Date:   03/02/2022  . Retic Panel    Standing Status:   Future    Number of Occurrences:   1    Standing Expiration Date:   03/02/2022    All questions were answered. The patient knows to call the clinic with any problems questions or concerns. Cc. [name], Victory Dakin, MD, PhD 03/03/2021

## 2021-03-05 ENCOUNTER — Telehealth: Payer: Self-pay

## 2021-03-05 NOTE — Telephone Encounter (Signed)
That is what is listed in her current medication list.

## 2021-03-05 NOTE — Telephone Encounter (Signed)
-----   Message from Earlie Server, MD sent at 03/03/2021  8:19 PM EDT ----- Please let patient know that her vitamin B12 level is low.  This may contribute to the low lymphocyte level. Recommend patient to start vitamin B12 1000 MCG daily.  Prescription has been sent.  Her pharmacy. Recommend patient continue oral iron supplementation once a day. Please adjust her follow-up appointment to 3 months, labs prior.  Labs are ordered.  Thank you

## 2021-03-05 NOTE — Telephone Encounter (Signed)
Patient notified.  Please move her scheduled lab/MD appts out for 3 months (labs 2 days prior to MD)

## 2021-03-06 LAB — COMP PANEL: LEUKEMIA/LYMPHOMA

## 2021-04-13 ENCOUNTER — Other Ambulatory Visit: Payer: 59

## 2021-04-17 ENCOUNTER — Ambulatory Visit: Payer: 59 | Admitting: Oncology

## 2021-05-14 ENCOUNTER — Telehealth: Payer: Self-pay

## 2021-05-14 NOTE — Telephone Encounter (Signed)
Placed call to pt. I asked pt could she get previous office who diagnose her to send referral, Pt stated she contacted the previous office before and they sent records over to new PCP to further continue care. Records have not been reviewed, they were sent after provider went on leave of absence. There are supporting notes in [facility] folder from [place] stating pt does have MS. Please advise.

## 2021-05-14 NOTE — Telephone Encounter (Signed)
Pt called and states that she is diagnosed to with MS and needs to see a neurologist. Please advise.

## 2021-05-15 ENCOUNTER — Other Ambulatory Visit: Payer: Self-pay | Admitting: Family

## 2021-05-15 DIAGNOSIS — G35 Multiple sclerosis: Secondary | ICD-10-CM

## 2021-05-30 ENCOUNTER — Other Ambulatory Visit: Payer: Self-pay | Admitting: Family

## 2021-05-31 ENCOUNTER — Other Ambulatory Visit: Payer: 59

## 2021-06-04 ENCOUNTER — Other Ambulatory Visit: Payer: 59

## 2021-06-06 ENCOUNTER — Ambulatory Visit: Payer: 59 | Admitting: Oncology

## 2021-07-05 ENCOUNTER — Encounter: Payer: Self-pay | Admitting: Adult Health

## 2021-07-09 ENCOUNTER — Ambulatory Visit: Payer: 59 | Admitting: Adult Health

## 2021-07-23 ENCOUNTER — Inpatient Hospital Stay (HOSPITAL_BASED_OUTPATIENT_CLINIC_OR_DEPARTMENT_OTHER): Payer: 59 | Admitting: Oncology

## 2021-07-23 ENCOUNTER — Other Ambulatory Visit: Payer: Self-pay

## 2021-07-23 ENCOUNTER — Encounter: Payer: Self-pay | Admitting: Oncology

## 2021-07-23 ENCOUNTER — Inpatient Hospital Stay: Payer: 59 | Attending: Oncology

## 2021-07-23 VITALS — BP 123/65 | HR 70 | Temp 99.6°F | Resp 18 | Wt 293.6 lb

## 2021-07-23 DIAGNOSIS — E611 Iron deficiency: Secondary | ICD-10-CM | POA: Insufficient documentation

## 2021-07-23 DIAGNOSIS — D72810 Lymphocytopenia: Secondary | ICD-10-CM

## 2021-07-23 DIAGNOSIS — Z8049 Family history of malignant neoplasm of other genital organs: Secondary | ICD-10-CM | POA: Diagnosis not present

## 2021-07-23 DIAGNOSIS — E538 Deficiency of other specified B group vitamins: Secondary | ICD-10-CM | POA: Insufficient documentation

## 2021-07-23 DIAGNOSIS — D649 Anemia, unspecified: Secondary | ICD-10-CM

## 2021-07-23 DIAGNOSIS — G35 Multiple sclerosis: Secondary | ICD-10-CM | POA: Diagnosis not present

## 2021-07-23 LAB — CBC WITH DIFFERENTIAL/PLATELET
Abs Immature Granulocytes: 0.03 10*3/uL (ref 0.00–0.07)
Basophils Absolute: 0 10*3/uL (ref 0.0–0.1)
Basophils Relative: 1 %
Eosinophils Absolute: 0.1 10*3/uL (ref 0.0–0.5)
Eosinophils Relative: 1 %
HCT: 39.5 % (ref 36.0–46.0)
Hemoglobin: 12.9 g/dL (ref 12.0–15.0)
Immature Granulocytes: 0 %
Lymphocytes Relative: 13 %
Lymphs Abs: 0.9 10*3/uL (ref 0.7–4.0)
MCH: 29.1 pg (ref 26.0–34.0)
MCHC: 32.7 g/dL (ref 30.0–36.0)
MCV: 89 fL (ref 80.0–100.0)
Monocytes Absolute: 0.7 10*3/uL (ref 0.1–1.0)
Monocytes Relative: 10 %
Neutro Abs: 5.4 10*3/uL (ref 1.7–7.7)
Neutrophils Relative %: 75 %
Platelets: 359 10*3/uL (ref 150–400)
RBC: 4.44 MIL/uL (ref 3.87–5.11)
RDW: 13.6 % (ref 11.5–15.5)
WBC: 7.2 10*3/uL (ref 4.0–10.5)
nRBC: 0 % (ref 0.0–0.2)

## 2021-07-23 LAB — RETIC PANEL
Immature Retic Fract: 18.9 % — ABNORMAL HIGH (ref 2.3–15.9)
RBC.: 4.48 MIL/uL (ref 3.87–5.11)
Retic Count, Absolute: 136.6 10*3/uL (ref 19.0–186.0)
Retic Ct Pct: 3.1 % (ref 0.4–3.1)
Reticulocyte Hemoglobin: 32.8 pg (ref >27.9)

## 2021-07-23 LAB — IRON AND TIBC
Iron: 44 ug/dL (ref 28–170)
Saturation Ratios: 12 % (ref 10.4–31.8)
TIBC: 377 ug/dL (ref 250–450)
UIBC: 333 ug/dL

## 2021-07-23 LAB — FERRITIN: Ferritin: 14 ng/mL (ref 11–307)

## 2021-07-23 LAB — VITAMIN B12: Vitamin B-12: 309 pg/mL (ref 180–914)

## 2021-07-23 NOTE — Progress Notes (Signed)
Pt here for follow up. No new concerns voiced.   

## 2021-07-23 NOTE — Progress Notes (Signed)
Hematology/Oncology follow up note [facility] Telephone:(336) [phone] Fax:(336) [phone]   Patient Care Team: Doreen Beam, FNP as PCP - General (Family Medicine)  REFERRING PROVIDER: Sharmon Leyden*  CHIEF COMPLAINTS/REASON FOR VISIT:  Iron deficiency anemia, lymphocytopenia, B12 deficiency  HISTORY OF PRESENTING ILLNESS:  Shelia Chapman is a  45 y.o.  female with PMH listed below who was referred to me for evaluation of anemia Reviewed patient's recent labs that was done.   Reviewed patient's previous labs ordered by primary care physician's office, patient reports a history of anemia, previously seen by hematologist and was recommended to take iron supplementation.  She moved from [place] to [place] in March 2022 and wants to establish care locally. She had blood work done on 02/15/2021.  Hemoglobin 12.8, normal total white blood cell count with slightly increased neutrophil.  Low lymphocyte 0.6. 02/15/2021 ferritin 15, iron saturation 19, TIBC 365. Patient reports mild chronic fatigue.  Otherwise doing well.  Denies any black or bloody bowel movement. She tolerates iron supplementation except mild constipation.   INTERVAL HISTORY Shelia Chapman is a 45 y.o. female who has above history reviewed by me today presents for follow up visit for management of Iron deficiency anemia, lymphocytopenia, B12 deficiency She takes oral vitamin b12 supplementation and oral ferrous sulfate supplementation once daily.  No new complaints.  She has menstrual period, history of fibroid resection.    Review of Systems  Constitutional:  Negative for appetite change, chills, fatigue and fever.  HENT:   Negative for hearing loss and voice change.   Eyes:  Negative for eye problems.  Respiratory:  Negative for chest tightness and cough.   Cardiovascular:  Negative for chest pain.  Gastrointestinal:  Negative for abdominal distention, abdominal pain and blood  in stool.  Endocrine: Negative for hot flashes.  Genitourinary:  Negative for difficulty urinating and frequency.   Musculoskeletal:  Negative for arthralgias.  Skin:  Negative for itching and rash.  Neurological:  Negative for extremity weakness.  Hematological:  Negative for adenopathy.  Psychiatric/Behavioral:  Negative for confusion.     MEDICAL HISTORY:  Past Medical History:  Diagnosis Date   Anemia of unknown etiology 02/28/2016   Chicken pox    Depression    Frequent headaches    Glaucoma    Multiple sclerosis ([facility])    Urine incontinence     SURGICAL HISTORY: Past Surgical History:  Procedure Laterality Date   UTERINE FIBROID SURGERY      SOCIAL HISTORY: Social History   Socioeconomic History   Marital status: Single    Spouse name: Not on file   Number of children: Not on file   Years of education: Not on file   Highest education level: Not on file  Occupational History   Not on file  Tobacco Use   Smoking status: Never   Smokeless tobacco: Never  Substance and Sexual Activity   Alcohol use: Never   Drug use: Never   Sexual activity: Not Currently  Other Topics Concern   Not on file  Social History Narrative   Not on file   Social Determinants of Health   Financial Resource Strain: Not on file  Food Insecurity: Not on file  Transportation Needs: Not on file  Physical Activity: Not on file  Stress: Not on file  Social Connections: Not on file  Intimate Partner Violence: Not on file    FAMILY HISTORY: Family History  Problem Relation Age of Onset   Depression  Mother    Diabetes Mother    Hyperlipidemia Mother    Hypertension Mother    Uterine cancer Mother    Diabetes Sister    Hypertension Sister    Breast cancer Neg Hx     ALLERGIES:  has No Known Allergies.  MEDICATIONS:  Current Outpatient Medications  Medication Sig Dispense Refill   baclofen (LIORESAL) 10 MG tablet Take 10 mg by mouth 3 (three) times daily as needed.      CRYSELLE-28 0.3-30 MG-MCG tablet Take 1 tablet by mouth daily.     FEROSUL 325 (65 Fe) MG tablet Take 325 mg by mouth daily.     GILENYA 0.5 MG CAPS Take 1 capsule by mouth daily.     LUMIGAN 0.01 % SOLN 1 drop at bedtime.     vitamin B-12 (CYANOCOBALAMIN) 1000 MCG tablet Take 1 tablet (1,000 mcg total) by mouth daily. (Patient not taking: Reported on 07/23/2021) 90 tablet 1   Vitamin D, Ergocalciferol, (DRISDOL) 1.25 MG (50000 UNIT) CAPS capsule Take 1 capsule (50,000 Units total) by mouth every 7 (seven) days. (taking one tablet per week) scheduled Vitamin D lab in  1-2 weeks after completing prescription. (Patient not taking: Reported on 07/23/2021) 12 capsule 0   No current facility-administered medications for this visit.     PHYSICAL EXAMINATION: ECOG PERFORMANCE STATUS: 0 - Asymptomatic Vitals:   07/23/21 1402  BP: 123/65  Pulse: 70  Resp: 18  Temp: 99.6 F (37.6 C)   Filed Weights   07/23/21 1402  Weight: 293 lb 9.6 oz (133.2 kg)    Physical Exam Constitutional:      General: She is not in acute distress. HENT:     Head: Normocephalic and atraumatic.  Eyes:     General: No scleral icterus. Cardiovascular:     Rate and Rhythm: Normal rate and regular rhythm.     Heart sounds: Normal heart sounds.  Pulmonary:     Effort: Pulmonary effort is normal. No respiratory distress.     Breath sounds: No wheezing.  Abdominal:     General: Bowel sounds are normal. There is no distension.     Palpations: Abdomen is soft.  Musculoskeletal:        General: No deformity. Normal range of motion.     Cervical back: Normal range of motion and neck supple.  Skin:    General: Skin is warm and dry.     Findings: No erythema or rash.  Neurological:     Mental Status: She is alert and oriented to person, place, and time. Mental status is at baseline.     Cranial Nerves: No cranial nerve deficit.     Coordination: Coordination normal.  Psychiatric:        Mood and Affect: Mood  normal.     LABORATORY DATA:  I have reviewed the data as listed Lab Results  Component Value Date   WBC 7.2 07/23/2021   HGB 12.9 07/23/2021   HCT 39.5 07/23/2021   MCV 89.0 07/23/2021   PLT 359 07/23/2021   Recent Labs    02/15/21 0938  NA 139  K 4.7  CL 105  CO2 27  GLUCOSE 109*  BUN 10  CREATININE 0.92  CALCIUM 9.0  PROT 6.8  ALBUMIN 4.0  AST 10  ALT 8  ALKPHOS 68  BILITOT 0.6    Iron/TIBC/Ferritin/ %Sat    Component Value Date/Time   IRON 44 07/23/2021 1345   TIBC 377 07/23/2021 1345   FERRITIN 14  07/23/2021 1345   IRONPCTSAT 12 07/23/2021 1345   IRONPCTSAT 19 02/15/2021 0938        ASSESSMENT & PLAN:  1. Lymphocytopenia   2. Anemia, unspecified type   3. B12 deficiency    Mild iron deficiency without anemia.  Iron panel result was available after her visit.  Improved, continue oral iron supplementation- ferrous sulfate once daily. - she may use otc supply.  Etiology is menstrual blood loss vs GI loss/malabsorption. She is >43y, refer to [facility] for colonoscopy. She agrees with the plan.   Lymphopenia, likely due to B12 deficiency, resolved.   B12 defiency, B12 is pending. recommend patient conitnue  B12 1036mcg daily.- may use otc supply  Follow up in 1 year.   Orders Placed This Encounter  Procedures   CBC with Differential/Platelet    Standing Status:   Future    Standing Expiration Date:   07/23/2022   Comprehensive metabolic panel    Standing Status:   Future    Standing Expiration Date:   07/23/2022   Iron and TIBC    Standing Status:   Future    Standing Expiration Date:   07/23/2022   Ferritin    Standing Status:   Future    Standing Expiration Date:   07/23/2022   Vitamin B12    Standing Status:   Future    Standing Expiration Date:   07/23/2022   Ambulatory referral to Gastroenterology    Referral Priority:   Routine    Referral Type:   Consultation    Referral Reason:   Specialty Services Required    Referred to  Provider:   Jonathon Bellows, MD    Number of Visits Requested:   1    All questions were answered. The patient knows to call the clinic with any problems questions or concerns. Cc. [name], Victory Dakin, MD, PhD 07/23/2021

## 2021-07-25 ENCOUNTER — Telehealth: Payer: Self-pay

## 2021-07-25 NOTE — Telephone Encounter (Signed)
-----   Message from Earlie Server, MD sent at 07/24/2021 11:26 PM EDT ----- Let her know that B12 is better. Continue taking B12 1053mcg daily.  ----- Message ----- From: Interface, Lab In [place] Sent: 07/23/2021   2:03 PM EDT To: Earlie Server, MD

## 2021-07-25 NOTE — Telephone Encounter (Signed)
Called pt and informed her of results and recommendation

## 2021-08-02 ENCOUNTER — Other Ambulatory Visit: Payer: Self-pay | Admitting: Neurology

## 2021-08-02 DIAGNOSIS — G35 Multiple sclerosis: Secondary | ICD-10-CM

## 2021-08-29 ENCOUNTER — Ambulatory Visit: Payer: 59 | Attending: Specialist

## 2021-08-29 ENCOUNTER — Ambulatory Visit: Payer: 59 | Admitting: Gastroenterology

## 2021-08-29 DIAGNOSIS — R0683 Snoring: Secondary | ICD-10-CM | POA: Insufficient documentation

## 2021-08-29 DIAGNOSIS — R0681 Apnea, not elsewhere classified: Secondary | ICD-10-CM | POA: Diagnosis present

## 2021-08-31 ENCOUNTER — Other Ambulatory Visit: Payer: Self-pay

## 2021-09-03 ENCOUNTER — Encounter: Payer: Self-pay | Admitting: Adult Health

## 2021-09-03 ENCOUNTER — Ambulatory Visit (INDEPENDENT_AMBULATORY_CARE_PROVIDER_SITE_OTHER): Payer: 59 | Admitting: Adult Health

## 2021-09-03 ENCOUNTER — Other Ambulatory Visit: Payer: Self-pay

## 2021-09-03 VITALS — BP 138/86 | HR 90 | Temp 95.6°F | Ht 63.39 in | Wt 296.4 lb

## 2021-09-03 DIAGNOSIS — Z3041 Encounter for surveillance of contraceptive pills: Secondary | ICD-10-CM | POA: Insufficient documentation

## 2021-09-03 DIAGNOSIS — Z23 Encounter for immunization: Secondary | ICD-10-CM | POA: Diagnosis not present

## 2021-09-03 DIAGNOSIS — G35 Multiple sclerosis: Secondary | ICD-10-CM | POA: Diagnosis not present

## 2021-09-03 DIAGNOSIS — E559 Vitamin D deficiency, unspecified: Secondary | ICD-10-CM

## 2021-09-03 DIAGNOSIS — Z6841 Body Mass Index (BMI) 40.0 and over, adult: Secondary | ICD-10-CM

## 2021-09-03 DIAGNOSIS — Z0000 Encounter for general adult medical examination without abnormal findings: Secondary | ICD-10-CM

## 2021-09-03 DIAGNOSIS — E785 Hyperlipidemia, unspecified: Secondary | ICD-10-CM

## 2021-09-03 MED ORDER — CRYSELLE-28 0.3-30 MG-MCG PO TABS: 1.0000 | ORAL_TABLET | Freq: Every day | ORAL | 1 refills | Status: DC

## 2021-09-03 NOTE — Progress Notes (Signed)
Acute Office Visit  Subjective:    Patient ID: Shelia Chapman, female    DOB: Oct 19, 1975, 45 y.o.   MRN: [id number]  Chief Complaint  Patient presents with   Follow-up    HPI Patient is in today for follow up.  Received medical records, past history of 2001 multiple sclerosis. She is now see by Dr. Melrose Nakayama for Multiple sclerosis still has fatigue daily but is improved.  She has had sleep study and waiting on results.  She has had an eye exam this year.  Was seen by Dr. Tasia Catchings in hematology worked up. Has follow up labs ordered.  She is on iron and vitamin D.   Request refill on birth control. History of fibroids. She reports she has had a abnormal pap in past is unsure when and " what " . Denies any history of STD's.   Patient's last menstrual period was 09/03/2021 (exact date).   Patient  denies any fever, body aches,chills, rash, chest pain, shortness of breath, nausea, vomiting, or diarrhea.    Past Medical History:  Diagnosis Date   Anemia of unknown etiology 02/28/2016   Chicken pox    Depression    Frequent headaches    Glaucoma    Multiple sclerosis ([place])    Urine incontinence     Past Surgical History:  Procedure Laterality Date   UTERINE FIBROID SURGERY      Family History  Problem Relation Age of Onset   Depression Mother    Diabetes Mother    Hyperlipidemia Mother    Hypertension Mother    Uterine cancer Mother    Diabetes Sister    Hypertension Sister    Breast cancer Neg Hx     Social History   Socioeconomic History   Marital status: Single    Spouse name: Not on file   Number of children: Not on file   Years of education: Not on file   Highest education level: Not on file  Occupational History   Not on file  Tobacco Use   Smoking status: Never   Smokeless tobacco: Never  Substance and Sexual Activity   Alcohol use: Never   Drug use: Never   Sexual activity: Not Currently  Other Topics Concern   Not on file  Social History Narrative    Not on file   Social Determinants of Health   Financial Resource Strain: Not on file  Food Insecurity: Not on file  Transportation Needs: Not on file  Physical Activity: Not on file  Stress: Not on file  Social Connections: Not on file  Intimate Partner Violence: Not on file    Outpatient Medications Prior to Visit  Medication Sig Dispense Refill   baclofen (LIORESAL) 10 MG tablet Take 10 mg by mouth 3 (three) times daily as needed.     FEROSUL 325 (65 Fe) MG tablet Take 325 mg by mouth daily.     GILENYA 0.5 MG CAPS Take 1 capsule by mouth daily.     LUMIGAN 0.01 % SOLN 1 drop at bedtime.     vitamin B-12 (CYANOCOBALAMIN) 1000 MCG tablet Take 1 tablet (1,000 mcg total) by mouth daily. 90 tablet 1   Vitamin D, Ergocalciferol, (DRISDOL) 1.25 MG (50000 UNIT) CAPS capsule Take 1 capsule (50,000 Units total) by mouth every 7 (seven) days. (taking one tablet per week) scheduled Vitamin D lab in  1-2 weeks after completing prescription. 12 capsule 0   CRYSELLE-28 0.3-30 MG-MCG tablet Take 1 tablet by mouth daily.  No facility-administered medications prior to visit.    No Known Allergies  Review of Systems  Constitutional:  Positive for fatigue. Negative for activity change, appetite change, chills, diaphoresis, fever and unexpected weight change.  HENT: Negative.    Respiratory: Negative.    Cardiovascular: Negative.   Gastrointestinal: Negative.   Genitourinary: Negative.   Musculoskeletal:  Positive for arthralgias. Negative for back pain, gait problem, joint swelling, myalgias, neck pain and neck stiffness.  Skin: Negative.   Neurological: Negative.   Hematological: Negative.   Psychiatric/Behavioral: Negative.        Objective:    Physical Exam Vitals reviewed.  Constitutional:      General: She is not in acute distress.    Appearance: She is well-developed. She is obese. She is not ill-appearing, toxic-appearing or diaphoretic.     Interventions: She is not  intubated. HENT:     Head: Normocephalic and atraumatic.     Right Ear: External ear normal.     Left Ear: External ear normal.     Nose: Nose normal. No congestion or rhinorrhea.     Mouth/Throat:     Mouth: Mucous membranes are moist.     Pharynx: No oropharyngeal exudate or posterior oropharyngeal erythema.  Eyes:     General: Lids are normal. No scleral icterus.       Right eye: No discharge.        Left eye: No discharge.     Conjunctiva/sclera: Conjunctivae normal.     Right eye: Right conjunctiva is not injected. No exudate or hemorrhage.    Left eye: Left conjunctiva is not injected. No exudate or hemorrhage.    Pupils: Pupils are equal, round, and reactive to light.  Neck:     Thyroid: No thyroid mass or thyromegaly.     Vascular: Normal carotid pulses. No carotid bruit, hepatojugular reflux or JVD.     Trachea: Trachea and phonation normal. No tracheal tenderness or tracheal deviation.     Meningeal: Brudzinski's sign and Kernig's sign absent.  Cardiovascular:     Rate and Rhythm: Normal rate and regular rhythm.     Pulses: Normal pulses.          Radial pulses are 2+ on the right side and 2+ on the left side.       Dorsalis pedis pulses are 2+ on the right side and 2+ on the left side.       Posterior tibial pulses are 2+ on the right side and 2+ on the left side.     Heart sounds: Normal heart sounds, S1 normal and S2 normal. Heart sounds not distant. No murmur heard.   No friction rub. No gallop.  Pulmonary:     Effort: Pulmonary effort is normal. No tachypnea, bradypnea, accessory muscle usage or respiratory distress. She is not intubated.     Breath sounds: Normal breath sounds. No stridor. No wheezing, rhonchi or rales.  Chest:     Chest wall: No tenderness.  Abdominal:     General: Bowel sounds are normal. There is no distension or abdominal bruit.     Palpations: Abdomen is soft. There is no shifting dullness, fluid wave, hepatomegaly, splenomegaly, mass or  pulsatile mass.     Tenderness: There is no abdominal tenderness. There is no guarding or rebound.     Hernia: No hernia is present.  Musculoskeletal:        General: No tenderness or deformity. Normal range of motion.     Cervical back:  Full passive range of motion without pain, normal range of motion and neck supple. No edema, erythema or rigidity. No spinous process tenderness or muscular tenderness. Normal range of motion.  Lymphadenopathy:     Head:     Right side of head: No submental, submandibular, tonsillar, preauricular, posterior auricular or occipital adenopathy.     Left side of head: No submental, submandibular, tonsillar, preauricular, posterior auricular or occipital adenopathy.     Cervical: No cervical adenopathy.     Right cervical: No superficial, deep or posterior cervical adenopathy.    Left cervical: No superficial, deep or posterior cervical adenopathy.     Upper Body:     Right upper body: No supraclavicular or pectoral adenopathy.     Left upper body: No supraclavicular or pectoral adenopathy.  Skin:    General: Skin is warm and dry.     Coloration: Skin is not pale.     Findings: No abrasion, bruising, burn, ecchymosis, erythema, lesion, petechiae or rash.     Nails: There is no clubbing.  Neurological:     Mental Status: She is alert and oriented to person, place, and time.     GCS: GCS eye subscore is 4. GCS verbal subscore is 5. GCS motor subscore is 6.     Cranial Nerves: No cranial nerve deficit.     Sensory: No sensory deficit.     Motor: No tremor, atrophy, abnormal muscle tone or seizure activity.     Coordination: Coordination normal.     Gait: Gait normal.     Deep Tendon Reflexes: Reflexes are normal and symmetric. Reflexes normal. Babinski sign absent on the right side. Babinski sign absent on the left side.     Reflex Scores:      Tricep reflexes are 2+ on the right side and 2+ on the left side.      Bicep reflexes are 2+ on the right side and  2+ on the left side.      Brachioradialis reflexes are 2+ on the right side and 2+ on the left side.      Patellar reflexes are 2+ on the right side and 2+ on the left side.      Achilles reflexes are 2+ on the right side and 2+ on the left side. Psychiatric:        Speech: Speech normal.        Behavior: Behavior normal.        Thought Content: Thought content normal.        Judgment: Judgment normal.    BP 138/86   Pulse 90   Temp (!) 95.6 F (35.3 C)   Ht 5' 3.39" (1.61 m)   Wt 296 lb 6.4 oz (134.4 kg)   LMP 09/03/2021 (Exact Date)   SpO2 98%   BMI 51.87 kg/m  Wt Readings from Last 3 Encounters:  09/03/21 296 lb 6.4 oz (134.4 kg)  07/23/21 293 lb 9.6 oz (133.2 kg)  03/02/21 294 lb 14.4 oz (133.8 kg)    Health Maintenance Due  Topic Date Due   TETANUS/TDAP  Never done   PAP SMEAR-Modifier  Never done    There are no preventive care reminders to display for this patient.   Lab Results  Component Value Date   TSH 2.93 02/15/2021   Lab Results  Component Value Date   WBC 7.2 07/23/2021   HGB 12.9 07/23/2021   HCT 39.5 07/23/2021   MCV 89.0 07/23/2021   PLT 359 07/23/2021  Lab Results  Component Value Date   NA 139 02/15/2021   K 4.7 02/15/2021   CO2 27 02/15/2021   GLUCOSE 109 (H) 02/15/2021   BUN 10 02/15/2021   CREATININE 0.92 02/15/2021   BILITOT 0.6 02/15/2021   ALKPHOS 68 02/15/2021   AST 10 02/15/2021   ALT 8 02/15/2021   PROT 6.8 02/15/2021   ALBUMIN 4.0 02/15/2021   CALCIUM 9.0 02/15/2021   GFR 74.35 02/15/2021   Lab Results  Component Value Date   CHOL 242 (H) 02/15/2021   Lab Results  Component Value Date   HDL 34.70 (L) 02/15/2021   Lab Results  Component Value Date   LDLCALC 180 (H) 02/15/2021   Lab Results  Component Value Date   TRIG 137.0 02/15/2021   Lab Results  Component Value Date   CHOLHDL 7 02/15/2021   No results found for: HGBA1C     Assessment & Plan:   Problem List Items Addressed This Visit        Nervous and Auditory   Multiple sclerosis ([place]) - Primary     Other   Vitamin D deficiency   Healthcare maintenance   Relevant Orders   Ambulatory referral to Obstetrics / Gynecology   Class 3 severe obesity without serious comorbidity with body mass index (BMI) of 50.0 to 59.9 in adult [facility])   Hyperlipidemia   Oral contraceptive pill surveillance   Relevant Orders   POCT urine pregnancy   Other Visit Diagnoses     Need for immunization against influenza       Relevant Orders   Flu Vaccine QUAD 56mo+IM (Fluarix, Fluzone & Alfiuria Quad PF) (Completed)      Multiple sclerosis followed by Dr. Melrose Nakayama.  She is doing well, has follow up scheduled.   Follow up labs ordered by hematology are ordered and due for iron studies, [facility]. Needs gynecology for pap, no previous documented however she reports something was " abnormal " on her previous-date unknown. Will refer to gynecology.   Meds ordered this encounter  Medications   CRYSELLE-28 0.3-30 MG-MCG tablet    Sig: Take 1 tablet by mouth daily.    Dispense:  28 tablet    Refill:  1    A low fat, low cholesterol is discussed with the patient, and a written copy is given to her.  Red Flags discussed. The patient was given clear instructions to go to ER or return to medical center if any red flags develop, symptoms do not improve, worsen or new problems develop. They verbalized understanding.  Return in about 3 months (around 12/04/2021), or if symptoms worsen or fail to improve, for Go to Emergency room/ urgent care if worse, at any time for any worsening symptoms.   Marcille Buffy, FNP

## 2021-09-03 NOTE — Patient Instructions (Signed)
Health Maintenance, Female Adopting a healthy lifestyle and getting preventive care are important in promoting health and wellness. Ask your health care provider about: The right schedule for you to have regular tests and exams. Things you can do on your own to prevent diseases and keep yourself healthy. What should I know about diet, weight, and exercise? Eat a healthy diet  Eat a diet that includes plenty of vegetables, fruits, low-fat dairy products, and lean protein. Do not eat a lot of foods that are high in solid fats, added sugars, or sodium. Maintain a healthy weight Body mass index (BMI) is used to identify weight problems. It estimates body fat based on height and weight. Your health care provider can help determine your BMI and help you achieve or maintain a healthy weight. Get regular exercise Get regular exercise. This is one of the most important things you can do for your health. Most adults should: Exercise for at least 150 minutes each week. The exercise should increase your heart rate and make you sweat (moderate-intensity exercise). Do strengthening exercises at least twice a week. This is in addition to the moderate-intensity exercise. Spend less time sitting. Even light physical activity can be beneficial. Watch cholesterol and blood lipids Have your blood tested for lipids and cholesterol at 47 years of age, then have this test every 5 years. Have your cholesterol levels checked more often if: Your lipid or cholesterol levels are high. You are older than 47 years of age. You are at high risk for heart disease. What should I know about cancer screening? Depending on your health history and family history, you may need to have cancer screening at various ages. This may include screening for: Breast cancer. Cervical cancer. Colorectal cancer. Skin cancer. Lung cancer. What should I know about heart disease, diabetes, and high blood pressure? Blood pressure and heart  disease High blood pressure causes heart disease and increases the risk of stroke. This is more likely to develop in people who have high blood pressure readings or are overweight. Have your blood pressure checked: Every 3-5 years if you are 18-39 years of age. Every year if you are 40 years old or older. Diabetes Have regular diabetes screenings. This checks your fasting blood sugar level. Have the screening done: Once every three years after age 40 if you are at a normal weight and have a low risk for diabetes. More often and at a younger age if you are overweight or have a high risk for diabetes. What should I know about preventing infection? Hepatitis B If you have a higher risk for hepatitis B, you should be screened for this virus. Talk with your health care provider to find out if you are at risk for hepatitis B infection. Hepatitis C Testing is recommended for: Everyone born from 1945 through 1965. Anyone with known risk factors for hepatitis C. Sexually transmitted infections (STIs) Get screened for STIs, including gonorrhea and chlamydia, if: You are sexually active and are younger than 47 years of age. You are older than 47 years of age and your health care provider tells you that you are at risk for this type of infection. Your sexual activity has changed since you were last screened, and you are at increased risk for chlamydia or gonorrhea. Ask your health care provider if you are at risk. Ask your health care provider about whether you are at high risk for HIV. Your health care provider may recommend a prescription medicine to help prevent HIV   infection. If you choose to take medicine to prevent HIV, you should first get tested for HIV. You should then be tested every 3 months for as long as you are taking the medicine. Pregnancy If you are about to stop having your period (premenopausal) and you may become pregnant, seek counseling before you get pregnant. Take 400 to 800  micrograms (mcg) of folic acid every day if you become pregnant. Ask for birth control (contraception) if you want to prevent pregnancy. Osteoporosis and menopause Osteoporosis is a disease in which the bones lose minerals and strength with aging. This can result in bone fractures. If you are 69 years old or older, or if you are at risk for osteoporosis and fractures, ask your health care provider if you should: Be screened for bone loss. Take a calcium or vitamin D supplement to lower your risk of fractures. Be given hormone replacement therapy (HRT) to treat symptoms of menopause. Follow these instructions at home: Alcohol use Do not drink alcohol if: Your health care provider tells you not to drink. You are pregnant, may be pregnant, or are planning to become pregnant. If you drink alcohol: Limit how much you have to: 0-1 drink a day. Know how much alcohol is in your drink. In the [place] one drink equals one 12 oz bottle of beer (355 mL), one 5 oz glass of wine (148 mL), or one 1 oz glass of hard liquor (44 mL). Lifestyle Do not use any products that contain nicotine or tobacco. These products include cigarettes, chewing tobacco, and vaping devices, such as e-cigarettes. If you need help quitting, ask your health care provider. Do not use street drugs. Do not share needles. Ask your health care provider for help if you need support or information about quitting drugs. General instructions Schedule regular health, dental, and eye exams. Stay current with your vaccines. Tell your health care provider if: You often feel depressed. You have ever been abused or do not feel safe at home. Summary Adopting a healthy lifestyle and getting preventive care are important in promoting health and wellness. Follow your health care provider's instructions about healthy diet, exercising, and getting tested or screened for diseases. Follow your health care provider's instructions on monitoring your  cholesterol and blood pressure. This information is not intended to replace advice given to you by your health care provider. Make sure you discuss any questions you have with your health care provider. Document Revised: 02/19/2021 Document Reviewed: 02/19/2021 Elsevier Patient Education  [employer]. Influenza Virus Vaccine injection (Fluarix) What is this medication? INFLUENZA VIRUS VACCINE (in floo EN zuh VAHY ruhs vak SEEN) helps to reduce the risk of getting influenza also known as the flu. This medicine may be used for other purposes; ask your health care provider or pharmacist if you have questions. COMMON BRAND NAME(S): Fluarix, Fluzone What should I tell my care team before I take this medication? They need to know if you have any of these conditions: bleeding disorder like hemophilia fever or infection Guillain-Barre syndrome or other neurological problems immune system problems infection with the human immunodeficiency virus (HIV) or AIDS low blood platelet counts multiple sclerosis an unusual or allergic reaction to influenza virus vaccine, eggs, chicken proteins, latex, gentamicin, other medicines, foods, dyes or preservatives pregnant or trying to get pregnant breast-feeding How should I use this medication? This vaccine is for injection into a muscle. It is given by a health care professional. A copy of Vaccine Information Statements will be given  before each vaccination. Read this sheet carefully each time. The sheet may change frequently. Talk to your pediatrician regarding the use of this medicine in children. Special care may be needed. Overdosage: If you think you have taken too much of this medicine contact a poison control center or emergency room at once. NOTE: This medicine is only for you. Do not share this medicine with others. What if I miss a dose? This does not apply. What may interact with this medication? chemotherapy or radiation therapy medicines  that lower your immune system like etanercept, anakinra, infliximab, and adalimumab medicines that treat or prevent blood clots like warfarin phenytoin steroid medicines like prednisone or cortisone theophylline vaccines This list may not describe all possible interactions. Give your health care provider a list of all the medicines, herbs, non-prescription drugs, or dietary supplements you use. Also tell them if you smoke, drink alcohol, or use illegal drugs. Some items may interact with your medicine. What should I watch for while using this medication? Report any side effects that do not go away within 3 days to your doctor or health care professional. Call your health care provider if any unusual symptoms occur within 6 weeks of receiving this vaccine. You may still catch the flu, but the illness is not usually as bad. You cannot get the flu from the vaccine. The vaccine will not protect against colds or other illnesses that may cause fever. The vaccine is needed every year. What side effects may I notice from receiving this medication? Side effects that you should report to your doctor or health care professional as soon as possible: allergic reactions like skin rash, itching or hives, swelling of the face, lips, or tongue Side effects that usually do not require medical attention (report to your doctor or health care professional if they continue or are bothersome): fever headache muscle aches and pains pain, tenderness, redness, or swelling at site where injected weak or tired This list may not describe all possible side effects. Call your doctor for medical advice about side effects. You may report side effects to FDA at 1-800-FDA-1088. Where should I keep my medication? This vaccine is only given in a clinic, pharmacy, doctor's office, or other health care setting and will not be stored at home. NOTE: This sheet is a summary. It may not cover all possible information. If you have  questions about this medicine, talk to your doctor, pharmacist, or health care provider.  2022 Elsevier/Gold Standard (2008-04-27 09:30:40)

## 2021-09-04 NOTE — Progress Notes (Signed)
Pt did not leave enough urine, more blood than urine for test to even read. Can change order to future and have pt come back once she's off her cycle or can cancel order?

## 2021-09-12 ENCOUNTER — Encounter: Payer: Self-pay | Admitting: Gastroenterology

## 2021-09-12 ENCOUNTER — Ambulatory Visit (INDEPENDENT_AMBULATORY_CARE_PROVIDER_SITE_OTHER): Payer: 59 | Admitting: Gastroenterology

## 2021-09-12 ENCOUNTER — Other Ambulatory Visit: Payer: Self-pay

## 2021-09-12 VITALS — BP 124/78 | HR 89 | Temp 98.4°F | Ht 63.0 in | Wt 300.0 lb

## 2021-09-12 DIAGNOSIS — E611 Iron deficiency: Secondary | ICD-10-CM

## 2021-09-12 DIAGNOSIS — Z1211 Encounter for screening for malignant neoplasm of colon: Secondary | ICD-10-CM | POA: Diagnosis not present

## 2021-09-12 DIAGNOSIS — E538 Deficiency of other specified B group vitamins: Secondary | ICD-10-CM

## 2021-09-12 NOTE — Progress Notes (Signed)
Shelia Bellows MD, MRCP(U.K) [address]  [place]  [place], [place] [address]  Main: [phone]  Fax: [phone]   Gastroenterology Consultation  Referring Provider:     Earlie Server, MD Primary Care Physician:  Doreen Beam, FNP Primary Gastroenterologist:  Dr. Jonathon Chapman  Reason for Consultation:     Iron deficiency anemia        HPI:   Shelia Chapman is a 45 y.o. y/o female referred for iron deficiency anemia.  Seen by Dr. Tasia Catchings in hematology in October 2022 was found to have a hemoglobin 12.8 g with a ferritin of 15 and iron percentage saturation of 19.  She was also B12 deficient and on B12 supplementation.   She denies any nasal bleeds, blood in stool, blood in urine.  Denies any NSAID use.  Denies any unintentional weight loss, no prior colonoscopy, no change in bowel habits.  She says her periods can be very heavy the first few days of her cycle changes anywhere from 8-10 pads and when it begins he can have a gush of blood each time she uses the restroom. CBC Latest Ref Rng & Units 07/23/2021 03/02/2021 02/15/2021  WBC 4.0 - 10.5 K/uL 7.2 6.8 4.8  Hemoglobin 12.0 - 15.0 g/dL 12.9 12.7 12.8  Hematocrit 36.0 - 46.0 % 39.5 39.4 39.1  Platelets 150 - 400 K/uL 359 339 327.0  ' Iron/TIBC/Ferritin/ %Sat    Component Value Date/Time   IRON 44 07/23/2021 1345   TIBC 377 07/23/2021 1345   FERRITIN 14 07/23/2021 1345   IRONPCTSAT 12 07/23/2021 1345   IRONPCTSAT 19 02/15/2021 0938    Past Medical History:  Diagnosis Date  . Anemia of unknown etiology 02/28/2016  . Chicken pox   . Depression   . Frequent headaches   . Glaucoma   . Multiple sclerosis ([place])   . Urine incontinence     Past Surgical History:  Procedure Laterality Date  . PARTIAL HYSTERECTOMY    . UTERINE FIBROID SURGERY      Prior to Admission medications   Medication Sig Start Date End Date Taking? Authorizing Provider  baclofen (LIORESAL) 10 MG tablet Take 10 mg by mouth 3 (three) times daily  as needed. 06/14/21   [provider]  CRYSELLE-28 0.3-30 MG-MCG tablet Take 1 tablet by mouth daily. 09/03/21   [name], Kelby Aline, FNP  FEROSUL 325 (65 Fe) MG tablet Take 325 mg by mouth daily. 02/08/21   [provider]  GILENYA 0.5 MG CAPS Take 1 capsule by mouth daily. 02/08/21   [provider]  LUMIGAN 0.01 % SOLN 1 drop at bedtime. 12/25/20   [provider]  vitamin B-12 (CYANOCOBALAMIN) 1000 MCG tablet Take 1 tablet (1,000 mcg total) by mouth daily. 03/03/21   Earlie Server, MD  Vitamin D, Ergocalciferol, (DRISDOL) 1.25 MG (50000 UNIT) CAPS capsule Take 1 capsule (50,000 Units total) by mouth every 7 (seven) days. (taking one tablet per week) scheduled Vitamin D lab in  1-2 weeks after completing prescription. 02/20/21   [name], Kelby Aline, FNP    Family History  Problem Relation Age of Onset  . Depression Mother   . Diabetes Mother   . Hyperlipidemia Mother   . Hypertension Mother   . Uterine cancer Mother   . Diabetes Sister   . Hypertension Sister   . Breast cancer Neg Hx      Social History   Tobacco Use  . Smoking status: Never  . Smokeless tobacco: Never  Substance Use Topics  . Alcohol use: Never  . Drug use: Never    Allergies as of 09/12/2021  . (No Known Allergies)    Review of Systems:    All systems reviewed and negative except where noted in HPI.   Physical Exam:  LMP 09/03/2021 (Exact Date)  Patient's last menstrual period was 09/03/2021 (exact date). Psych:  Alert and cooperative. Normal mood and affect. General:   Alert,  Well-developed, well-nourished, pleasant and cooperative in NAD Head:  Normocephalic and atraumatic. Eyes:  Sclera clear, no icterus.   Conjunctiva pink. Ears:  Normal auditory acuity. Abdomen:  Normal bowel sounds.  No bruits.  Soft, non-tender and non-distended without masses, hepatosplenomegaly or hernias noted.  No guarding or rebound tenderness.    Msk:  Symmetrical without gross deformities.  Good, equal movement & strength bilaterally. Pulses:  Normal pulses noted. Extremities:  No clubbing or edema.  No cyanosis. Neurologic:  Alert and oriented x3;  grossly normal neurologically. Psych:  Alert and cooperative. Normal mood and affect.  Imaging Studies: SLEEP STUDY DOCUMENTS  Result Date: 09/03/2021 Ordered by an unspecified provider.  SLEEP STUDY DOCUMENTS  Result Date: 09/03/2021 Ordered by an unspecified provider.   Assessment and Plan:   Shelia Chapman is a 45 y.o. y/o female has been referred for iron deficiency.  Referred to GI for further evaluation.  She also has B12 deficiency.  Likely cause of iron deficiency is menorrhagia.  She states she is on birth control at this point of time.  Plan 1.  EGD and colonoscopy to evaluate for B12 deficiency rule out atrophic gastritis as well as for colon cancer screening  2.  Check celiac serology, H. pylori breath test and urinalysis  I have discussed alternative options, risks & benefits,  which include, but are not limited to, bleeding, infection, perforation,respiratory complication & drug reaction.  The patient agrees with this plan & written consent will be obtained.     Follow up in 4 months   Dr Shelia Bellows MD,MRCP(U.K)

## 2021-09-13 LAB — CELIAC DISEASE AB SCREEN W/RFX
Antigliadin Abs, IgA: 3 units (ref 0–19)
IgA/Immunoglobulin A, Serum: 135 mg/dL (ref 87–352)
Transglutaminase IgA: <2 U/mL (ref 0–3)

## 2021-09-13 LAB — URINALYSIS
Bilirubin, UA: NEGATIVE
Glucose, UA: NEGATIVE
Ketones, UA: NEGATIVE
Leukocytes,UA: NEGATIVE
Nitrite, UA: NEGATIVE
Specific Gravity, UA: 1.023 (ref 1.005–1.030)
Urobilinogen, Ur: 0.2 mg/dL (ref 0.2–1.0)
pH, UA: 5.5 (ref 5.0–7.5)

## 2021-09-13 LAB — H. PYLORI BREATH TEST: H pylori Breath Test: NEGATIVE

## 2021-09-19 ENCOUNTER — Telehealth: Payer: Self-pay

## 2021-09-19 NOTE — Telephone Encounter (Signed)
Pt return call. Advise Pt of below note. Pt is schedule to follow up with Np [name] on 09/26/21 at 9:30am.

## 2021-09-19 NOTE — Telephone Encounter (Signed)
-----   Message from Doreen Beam, [place] sent at 09/19/2021 10:34 AM EST ----- Thank you. Kristal please order a repeat urinalysis with microscopic  and schedule for patient to have rechecked in 2 weeks.  ----- Message ----- From: Jonathon Bellows, MD Sent: 09/19/2021  10:33 AM EST To: Doreen Beam, FNP, Wayna Chalet, CMA  [place]  Form patient H. pylori breath test, celiac serology was negative.  Urine showed 1+ RBC.  May want to follow-up with [name], Kelby Aline, FNP and have it rechecked in a few weeks and if it still persistently shows RBCs in the urine may need to get further evaluation

## 2021-09-19 NOTE — Progress Notes (Signed)
Please see note order urinalysis to recheck for RBC, in 2- 3 weeks.

## 2021-09-26 ENCOUNTER — Other Ambulatory Visit: Payer: Self-pay

## 2021-09-26 ENCOUNTER — Encounter: Payer: Self-pay | Admitting: Adult Health

## 2021-09-26 ENCOUNTER — Ambulatory Visit (INDEPENDENT_AMBULATORY_CARE_PROVIDER_SITE_OTHER): Payer: 59 | Admitting: Adult Health

## 2021-09-26 VITALS — BP 128/74 | HR 88 | Temp 95.9°F | Ht 62.99 in | Wt 294.2 lb

## 2021-09-26 DIAGNOSIS — G35 Multiple sclerosis: Secondary | ICD-10-CM

## 2021-09-26 DIAGNOSIS — Z6841 Body Mass Index (BMI) 40.0 and over, adult: Secondary | ICD-10-CM

## 2021-09-26 DIAGNOSIS — R319 Hematuria, unspecified: Secondary | ICD-10-CM

## 2021-09-26 DIAGNOSIS — R5383 Other fatigue: Secondary | ICD-10-CM

## 2021-09-26 LAB — IBC + FERRITIN
Ferritin: 10.6 ng/mL (ref 10.0–291.0)
Iron: 75 ug/dL (ref 42–145)
Saturation Ratios: 18.5 % — ABNORMAL LOW (ref 20.0–50.0)
TIBC: 404.6 ug/dL (ref 250.0–450.0)
Transferrin: 289 mg/dL (ref 212.0–360.0)

## 2021-09-26 LAB — CBC WITH DIFFERENTIAL/PLATELET
Basophils Absolute: 0 10*3/uL (ref 0.0–0.1)
Basophils Relative: 0.4 % (ref 0.0–3.0)
Eosinophils Absolute: 0.2 10*3/uL (ref 0.0–0.7)
Eosinophils Relative: 3.4 % (ref 0.0–5.0)
HCT: 38.5 % (ref 36.0–46.0)
Hemoglobin: 12.5 g/dL (ref 12.0–15.0)
Lymphocytes Relative: 12.8 % (ref 12.0–46.0)
Lymphs Abs: 0.6 10*3/uL — ABNORMAL LOW (ref 0.7–4.0)
MCHC: 32.5 g/dL (ref 30.0–36.0)
MCV: 88.5 fl (ref 78.0–100.0)
Monocytes Absolute: 0.4 10*3/uL (ref 0.1–1.0)
Monocytes Relative: 7.5 % (ref 3.0–12.0)
Neutro Abs: 3.8 10*3/uL (ref 1.4–7.7)
Neutrophils Relative %: 75.9 % (ref 43.0–77.0)
Platelets: 331 10*3/uL (ref 150.0–400.0)
RBC: 4.35 Mil/uL (ref 3.87–5.11)
RDW: 14.1 % (ref 11.5–15.5)
WBC: 4.9 10*3/uL (ref 4.0–10.5)

## 2021-09-26 LAB — COMPREHENSIVE METABOLIC PANEL
ALT: 8 U/L (ref 0–35)
AST: 10 U/L (ref 0–37)
Albumin: 3.9 g/dL (ref 3.5–5.2)
Alkaline Phosphatase: 68 U/L (ref 39–117)
BUN: 7 mg/dL (ref 6–23)
CO2: 28 mEq/L (ref 19–32)
Calcium: 9 mg/dL (ref 8.4–10.5)
Chloride: 104 mEq/L (ref 96–112)
Creatinine, Ser: 0.98 mg/dL (ref 0.40–1.20)
GFR: 68.63 mL/min (ref >60.00)
Glucose, Bld: 110 mg/dL — ABNORMAL HIGH (ref 70–99)
Potassium: 4.7 mEq/L (ref 3.5–5.1)
Sodium: 137 mEq/L (ref 135–145)
Total Bilirubin: 0.6 mg/dL (ref 0.2–1.2)
Total Protein: 6.8 g/dL (ref 6.0–8.3)

## 2021-09-26 LAB — VITAMIN B12: Vitamin B-12: 781 pg/mL (ref 211–911)

## 2021-09-26 NOTE — Patient Instructions (Addendum)
Recheck urine 1 week after menses and lab blood work today.   Health Maintenance, Female Adopting a healthy lifestyle and getting preventive care are important in promoting health and wellness. Ask your health care provider about: The right schedule for you to have regular tests and exams. Things you can do on your own to prevent diseases and keep yourself healthy. What should I know about diet, weight, and exercise? Eat a healthy diet  Eat a diet that includes plenty of vegetables, fruits, low-fat dairy products, and lean protein. Do not eat a lot of foods that are high in solid fats, added sugars, or sodium. Maintain a healthy weight Body mass index (BMI) is used to identify weight problems. It estimates body fat based on height and weight. Your health care provider can help determine your BMI and help you achieve or maintain a healthy weight. Get regular exercise Get regular exercise. This is one of the most important things you can do for your health. Most adults should: Exercise for at least 150 minutes each week. The exercise should increase your heart rate and make you sweat (moderate-intensity exercise). Do strengthening exercises at least twice a week. This is in addition to the moderate-intensity exercise. Spend less time sitting. Even light physical activity can be beneficial. Watch cholesterol and blood lipids Have your blood tested for lipids and cholesterol at 47 years of age, then have this test every 5 years. Have your cholesterol levels checked more often if: Your lipid or cholesterol levels are high. You are older than 47 years of age. You are at high risk for heart disease. What should I know about cancer screening? Depending on your health history and family history, you may need to have cancer screening at various ages. This may include screening for: Breast cancer. Cervical cancer. Colorectal cancer. Skin cancer. Lung cancer. What should I know about heart  disease, diabetes, and high blood pressure? Blood pressure and heart disease High blood pressure causes heart disease and increases the risk of stroke. This is more likely to develop in people who have high blood pressure readings or are overweight. Have your blood pressure checked: Every 3-5 years if you are 52-52 years of age. Every year if you are 61 years old or older. Diabetes Have regular diabetes screenings. This checks your fasting blood sugar level. Have the screening done: Once every three years after age 21 if you are at a normal weight and have a low risk for diabetes. More often and at a younger age if you are overweight or have a high risk for diabetes. What should I know about preventing infection? Hepatitis B If you have a higher risk for hepatitis B, you should be screened for this virus. Talk with your health care provider to find out if you are at risk for hepatitis B infection. Hepatitis C Testing is recommended for: Everyone born from [birth year] through 1967. Anyone with known risk factors for hepatitis C. Sexually transmitted infections (STIs) Get screened for STIs, including gonorrhea and chlamydia, if: You are sexually active and are younger than 47 years of age. You are older than 47 years of age and your health care provider tells you that you are at risk for this type of infection. Your sexual activity has changed since you were last screened, and you are at increased risk for chlamydia or gonorrhea. Ask your health care provider if you are at risk. Ask your health care provider about whether you are at high risk for HIV.  Your health care provider may recommend a prescription medicine to help prevent HIV infection. If you choose to take medicine to prevent HIV, you should first get tested for HIV. You should then be tested every 3 months for as long as you are taking the medicine. Pregnancy If you are about to stop having your period (premenopausal) and you may become  pregnant, seek counseling before you get pregnant. Take 400 to 800 micrograms (mcg) of folic acid every day if you become pregnant. Ask for birth control (contraception) if you want to prevent pregnancy. Osteoporosis and menopause Osteoporosis is a disease in which the bones lose minerals and strength with aging. This can result in bone fractures. If you are 44 years old or older, or if you are at risk for osteoporosis and fractures, ask your health care provider if you should: Be screened for bone loss. Take a calcium or vitamin D supplement to lower your risk of fractures. Be given hormone replacement therapy (HRT) to treat symptoms of menopause. Follow these instructions at home: Alcohol use Do not drink alcohol if: Your health care provider tells you not to drink. You are pregnant, may be pregnant, or are planning to become pregnant. If you drink alcohol: Limit how much you have to: 0-1 drink a day. Know how much alcohol is in your drink. In the [place] one drink equals one 12 oz bottle of beer (355 mL), one 5 oz glass of wine (148 mL), or one 1 oz glass of hard liquor (44 mL). Lifestyle Do not use any products that contain nicotine or tobacco. These products include cigarettes, chewing tobacco, and vaping devices, such as e-cigarettes. If you need help quitting, ask your health care provider. Do not use street drugs. Do not share needles. Ask your health care provider for help if you need support or information about quitting drugs. General instructions Schedule regular health, dental, and eye exams. Stay current with your vaccines. Tell your health care provider if: You often feel depressed. You have ever been abused or do not feel safe at home. Summary Adopting a healthy lifestyle and getting preventive care are important in promoting health and wellness. Follow your health care provider's instructions about healthy diet, exercising, and getting tested or screened for  diseases. Follow your health care provider's instructions on monitoring your cholesterol and blood pressure. This information is not intended to replace advice given to you by your health care provider. Make sure you discuss any questions you have with your health care provider. Document Revised: 02/19/2021 Document Reviewed: 02/19/2021 Elsevier Patient Education  [place] [employer] Calories are units of energy. Your body needs a certain number of calories from food to keep going throughout the day. When you eat or drink more calories than your body needs, your body stores the extra calories mostly as fat. When you eat or drink fewer calories than your body needs, your body burns fat to get the energy it needs. Calorie counting means keeping track of how many calories you eat and drink each day. Calorie counting can be helpful if you need to lose weight. If you eat fewer calories than your body needs, you should lose weight. Ask your health care provider what a healthy weight is for you. For calorie counting to work, you will need to eat the right number of calories each day to lose a healthy amount of weight per week. A dietitian can help you figure out how many calories you  need in a day and will suggest ways to reach your calorie goal. A healthy amount of weight to lose each week is usually 1-2 lb (0.5-0.9 kg). This usually means that your daily calorie intake should be reduced by 500-750 calories. Eating 1,200-1,500 calories a day can help most women lose weight. Eating 1,500-1,800 calories a day can help most men lose weight. What do I need to know about calorie counting? Work with your health care provider or dietitian to determine how many calories you should get each day. To meet your daily calorie goal, you will need to: Find out how many calories are in each food that you would like to eat. Try to do this before you eat. Decide how much of the food you  plan to eat. Keep a food log. Do this by writing down what you ate and how many calories it had. To successfully lose weight, it is important to balance calorie counting with a healthy lifestyle that includes regular activity. Where do I find calorie information? The number of calories in a food can be found on a Nutrition Facts label. If a food does not have a Nutrition Facts label, try to look up the calories online or ask your dietitian for help. Remember that calories are listed per serving. If you choose to have more than one serving of a food, you will have to multiply the calories per serving by the number of servings you plan to eat. For example, the label on a package of bread might say that a serving size is 1 slice and that there are 90 calories in a serving. If you eat 1 slice, you will have eaten 90 calories. If you eat 2 slices, you will have eaten 180 calories. How do I keep a food log? After each time that you eat, record the following in your food log as soon as possible: What you ate. Be sure to include toppings, sauces, and other extras on the food. How much you ate. This can be measured in cups, ounces, or number of items. How many calories were in each food and drink. The total number of calories in the food you ate. Keep your food log near you, such as in a pocket-sized notebook or on an app or website on your mobile phone. Some programs will calculate calories for you and show you how many calories you have left to meet your daily goal. What are some portion-control tips? Know how many calories are in a serving. This will help you know how many servings you can have of a certain food. Use a measuring cup to measure serving sizes. You could also try weighing out portions on a kitchen scale. With time, you will be able to estimate serving sizes for some foods. Take time to put servings of different foods on your favorite plates or in your favorite bowls and cups so you know what  a serving looks like. Try not to eat straight from a food's packaging, such as from a bag or box. Eating straight from the package makes it hard to see how much you are eating and can lead to overeating. Put the amount you would like to eat in a cup or on a plate to make sure you are eating the right portion. Use smaller plates, glasses, and bowls for smaller portions and to prevent overeating. Try not to multitask. For example, avoid watching TV or using your computer while eating. If it is time to eat, sit  down at a table and enjoy your food. This will help you recognize when you are full. It will also help you be more mindful of what and how much you are eating. What are tips for following this plan? Reading food labels Check the calorie count compared with the serving size. The serving size may be smaller than what you are used to eating. Check the source of the calories. Try to choose foods that are high in protein, fiber, and vitamins, and low in saturated fat, trans fat, and sodium. Shopping Read nutrition labels while you shop. This will help you make healthy decisions about which foods to buy. Pay attention to nutrition labels for low-fat or fat-free foods. These foods sometimes have the same number of calories or more calories than the full-fat versions. They also often have added sugar, starch, or salt to make up for flavor that was removed with the fat. Make a grocery list of lower-calorie foods and stick to it. Cooking Try to cook your favorite foods in a healthier way. For example, try baking instead of frying. Use low-fat dairy products. Meal planning Use more fruits and vegetables. One-half of your plate should be fruits and vegetables. Include lean proteins, such as chicken, [place], and fish. Lifestyle Each week, aim to do one of the following: 150 minutes of moderate exercise, such as walking. 75 minutes of vigorous exercise, such as running. General information Know how many  calories are in the foods you eat most often. This will help you calculate calorie counts faster. Find a way of tracking calories that works for you. Get creative. Try different apps or programs if writing down calories does not work for you. What foods should I eat?  Eat nutritious foods. It is better to have a nutritious, high-calorie food, such as an avocado, than a food with few nutrients, such as a bag of potato chips. Use your calories on foods and drinks that will fill you up and will not leave you hungry soon after eating. Examples of foods that fill you up are nuts and nut butters, vegetables, lean proteins, and high-fiber foods such as whole grains. High-fiber foods are foods with more than 5 g of fiber per serving. Pay attention to calories in drinks. Low-calorie drinks include water and unsweetened drinks. The items listed above may not be a complete list of foods and beverages you can eat. Contact a dietitian for more information. What foods should I limit? Limit foods or drinks that are not good sources of vitamins, minerals, or protein or that are high in unhealthy fats. These include: Candy. Other sweets. Sodas, specialty coffee drinks, alcohol, and juice. The items listed above may not be a complete list of foods and beverages you should avoid. Contact a dietitian for more information. How do I count calories when eating out? Pay attention to portions. Often, portions are much larger when eating out. Try these tips to keep portions smaller: Consider sharing a meal instead of getting your own. If you get your own meal, eat only half of it. Before you start eating, ask for a container and put half of your meal into it. When available, consider ordering smaller portions from the menu instead of full portions. Pay attention to your food and drink choices. Knowing the way food is cooked and what is included with the meal can help you eat fewer calories. If calories are listed on the  menu, choose the lower-calorie options. Choose dishes that include vegetables, fruits, whole  grains, low-fat dairy products, and lean proteins. Choose items that are boiled, broiled, grilled, or steamed. Avoid items that are buttered, battered, fried, or served with cream sauce. Items labeled as crispy are usually fried, unless stated otherwise. Choose water, low-fat milk, unsweetened iced tea, or other drinks without added sugar. If you want an alcoholic beverage, choose a lower-calorie option, such as a glass of wine or light beer. Ask for dressings, sauces, and syrups on the side. These are usually high in calories, so you should limit the amount you eat. If you want a salad, choose a garden salad and ask for grilled meats. Avoid extra toppings such as bacon, cheese, or fried items. Ask for the dressing on the side, or ask for olive oil and vinegar or lemon to use as dressing. Estimate how many servings of a food you are given. Knowing serving sizes will help you be aware of how much food you are eating at restaurants. Where to find more information Centers for Disease Control and Prevention: http://www.wolf.info/ [place] Department of Agriculture: http://www.wilson-mendoza.org/ Summary Calorie counting means keeping track of how many calories you eat and drink each day. If you eat fewer calories than your body needs, you should lose weight. A healthy amount of weight to lose per week is usually 1-2 lb (0.5-0.9 kg). This usually means reducing your daily calorie intake by 500-750 calories. The number of calories in a food can be found on a Nutrition Facts label. If a food does not have a Nutrition Facts label, try to look up the calories online or ask your dietitian for help. Use smaller plates, glasses, and bowls for smaller portions and to prevent overeating. Use your calories on foods and drinks that will fill you up and not leave you hungry shortly after a meal. This information is not intended to replace advice given to you  by your health care provider. Make sure you discuss any questions you have with your health care provider. Document Revised: 11/11/2019 Document Reviewed: 11/11/2019 Elsevier Patient Education  2022 [place] and Cholesterol Restricted Eating Plan Getting too much fat and cholesterol in your diet may cause health problems. Choosing the right foods helps keep your fat and cholesterol at normal levels. This can keep you from getting certain diseases. Your doctor may recommend an eating plan that includes: Total fat: ______% or less of total calories a day. This is ______g of fat a day. Saturated fat: ______% or less of total calories a day. This is ______g of saturated fat a day. Cholesterol: less than _________mg a day. Fiber: ______g a day. What are tips for following this plan? General tips Work with your doctor to lose weight if you need to. Avoid: Foods with added sugar. Fried foods. Foods with trans fat or partially hydrogenated oils. This includes some margarines and baked goods. If you drink alcohol: Limit how much you have to: 0-1 drink a day for women who are not pregnant. 0-2 drinks a day for men. Know how much alcohol is in a drink. In the [place] one drink equals one 12 oz bottle of beer (355 mL), one 5 oz glass of wine (148 mL), or one 1 oz glass of hard liquor (44 mL). Reading food labels Check food labels for: Trans fats. Partially hydrogenated oils. Saturated fat (g) in each serving. Cholesterol (mg) in each serving. Fiber (g) in each serving. Choose foods with healthy fats, such as: Monounsaturated fats and polyunsaturated fats. These include olive and canola oil,  flaxseeds, walnuts, almonds, and seeds. Omega-3 fats. These are found in certain fish, flaxseed oil, and ground flaxseeds. Choose grain products that have whole grains. Look for the word "whole" as the first word in the ingredient list. Cooking Cook foods using low-fat methods. These include  baking, boiling, grilling, and broiling. Eat more home-cooked foods. Eat at restaurants and buffets less often. Eat less fast food. Avoid cooking using saturated fats, such as butter, cream, palm oil, palm kernel oil, and coconut oil. Meal planning  At meals, divide your plate into four equal parts: Fill one-half of your plate with vegetables, green salads, and fruit. Fill one-fourth of your plate with whole grains. Fill one-fourth of your plate with low-fat (lean) protein foods. Eat fish that is high in omega-3 fats at least two times a week. This includes mackerel, tuna, sardines, and salmon. Eat foods that are high in fiber, such as whole grains, beans, apples, pears, berries, broccoli, carrots, peas, and barley. What foods should I eat? Fruits All fresh, canned (in natural juice), or frozen fruits. Vegetables Fresh or frozen vegetables (raw, steamed, roasted, or grilled). Green salads. Grains Whole grains, such as whole wheat or whole grain breads, crackers, cereals, and pasta. Unsweetened oatmeal, bulgur, barley, quinoa, or brown rice. Corn or whole wheat flour tortillas. Meats and other protein foods Ground beef (85% or leaner), grass-fed beef, or beef trimmed of fat. Skinless chicken or [place]. Ground chicken or [place]. Pork trimmed of fat. All fish and seafood. Egg whites. Dried beans, peas, or lentils. Unsalted nuts or seeds. Unsalted canned beans. Nut butters without added sugar or oil. Dairy Low-fat or nonfat dairy products, such as skim or 1% milk, 2% or reduced-fat cheeses, low-fat and fat-free ricotta or cottage cheese, or plain low-fat and nonfat yogurt. Fats and oils Tub margarine without trans fats. Light or reduced-fat mayonnaise and salad dressings. Avocado. Olive, canola, sesame, or safflower oils. The items listed above may not be a complete list of foods and beverages you can eat. Contact a dietitian for more information. What foods should I avoid? Fruits Canned fruit  in heavy syrup. Fruit in cream or butter sauce. Fried fruit. Vegetables Vegetables cooked in cheese, cream, or butter sauce. Fried vegetables. Grains White bread. White pasta. White rice. Cornbread. Bagels, pastries, and croissants. Crackers and snack foods that contain trans fat and hydrogenated oils. Meats and other protein foods Fatty cuts of meat. Ribs, chicken wings, bacon, sausage, bologna, salami, chitterlings, fatback, hot dogs, bratwurst, and packaged lunch meats. Liver and organ meats. Whole eggs and egg yolks. Chicken and [place] with skin. Fried meat. Dairy Whole or 2% milk, cream, half-and-half, and cream cheese. Whole milk cheeses. Whole-fat or sweetened yogurt. Full-fat cheeses. Nondairy creamers and whipped toppings. Processed cheese, cheese spreads, and cheese curds. Fats and oils Butter, stick margarine, lard, shortening, ghee, or bacon fat. Coconut, palm kernel, and palm oils. Beverages Alcohol. Sugar-sweetened drinks such as sodas, lemonade, and fruit drinks. Sweets and desserts Corn syrup, sugars, honey, and molasses. Candy. Jam and jelly. Syrup. Sweetened cereals. Cookies, pies, cakes, donuts, muffins, and ice cream. The items listed above may not be a complete list of foods and beverages you should avoid. Contact a dietitian for more information. Summary Choosing the right foods helps keep your fat and cholesterol at normal levels. This can keep you from getting certain diseases. At meals, fill one-half of your plate with vegetables, green salads, and fruits. Eat high fiber foods, like whole grains, beans, apples, pears, berries, carrots, peas,  and barley. Limit added sugar, saturated fats, alcohol, and fried foods. This information is not intended to replace advice given to you by your health care provider. Make sure you discuss any questions you have with your health care provider. Document Revised: 02/09/2021 Document Reviewed: 02/09/2021 Elsevier Patient Education   [place].

## 2021-09-26 NOTE — Progress Notes (Signed)
Acute Office Visit  Subjective:    Patient ID: Shelia Chapman, female    DOB: 02-22-1976, 45 y.o.   MRN: [id number]  Chief Complaint  Patient presents with   Follow-up    HPI Patient is in today for follow up she was noted to have 1+ red blood cell in her urine Dr. Alysia Penna office on 09/13/2019, she reports she is on her menses today.  She reports her cycles last and have always lasted about 1-1/2 weeks.  She is ending her cycle now.  She will return for repeat urinalysis and urine culture and 1 week after her menses.  She denies any abnormal vaginal bleeding or breakthrough bleeding.  She denies any pelvic pain.  She has no concerns today.  She is due for follow-up on her labs those labs will be ordered today are already in his future order.  Patient  denies any fever, chills, rash, chest pain, shortness of breath, nausea, vomiting, or diarrhea.  Denies dizziness, lightheadedness, pre syncopal or syncopal episodes.    Past Medical History:  Diagnosis Date   Anemia of unknown etiology 02/28/2016   Chicken pox    Depression    Frequent headaches    Glaucoma    Multiple sclerosis ([place])    Urine incontinence     Past Surgical History:  Procedure Laterality Date   PARTIAL HYSTERECTOMY     UTERINE FIBROID SURGERY      Family History  Problem Relation Age of Onset   Depression Mother    Diabetes Mother    Hyperlipidemia Mother    Hypertension Mother    Uterine cancer Mother    Diabetes Sister    Hypertension Sister    Breast cancer Neg Hx     Social History   Socioeconomic History   Marital status: Single    Spouse name: Not on file   Number of children: Not on file   Years of education: Not on file   Highest education level: Not on file  Occupational History   Not on file  Tobacco Use   Smoking status: Never   Smokeless tobacco: Never  Substance and Sexual Activity   Alcohol use: Never   Drug use: Never   Sexual activity: Not Currently  Other Topics  Concern   Not on file  Social History Narrative   Not on file   Social Determinants of Health   Financial Resource Strain: Not on file  Food Insecurity: Not on file  Transportation Needs: Not on file  Physical Activity: Not on file  Stress: Not on file  Social Connections: Not on file  Intimate Partner Violence: Not on file    Outpatient Medications Prior to Visit  Medication Sig Dispense Refill   baclofen (LIORESAL) 10 MG tablet Take 10 mg by mouth 3 (three) times daily as needed.     CRYSELLE-28 0.3-30 MG-MCG tablet Take 1 tablet by mouth daily. 28 tablet 1   FEROSUL 325 (65 Fe) MG tablet Take 325 mg by mouth daily.     GILENYA 0.5 MG CAPS Take 1 capsule by mouth daily.     LUMIGAN 0.01 % SOLN 1 drop at bedtime.     vitamin B-12 (CYANOCOBALAMIN) 1000 MCG tablet Take 1 tablet (1,000 mcg total) by mouth daily. 90 tablet 1   Vitamin D, Ergocalciferol, (DRISDOL) 1.25 MG (50000 UNIT) CAPS capsule Take 1 capsule (50,000 Units total) by mouth every 7 (seven) days. (taking one tablet per week) scheduled Vitamin D lab in  1-2 weeks after completing prescription. 12 capsule 0   No facility-administered medications prior to visit.    No Known Allergies  Review of Systems  Constitutional: Negative.   HENT: Negative.    Respiratory: Negative.    Cardiovascular: Negative.   Gastrointestinal: Negative.   Genitourinary: Negative.        Menses  today  Musculoskeletal: Negative.   Skin: Negative.       Objective:    Physical Exam Vitals reviewed.  Constitutional:      General: She is not in acute distress.    Appearance: She is well-developed. She is obese. She is not ill-appearing, toxic-appearing or diaphoretic.     Interventions: She is not intubated. HENT:     Head: Normocephalic and atraumatic.     Right Ear: External ear normal.     Left Ear: External ear normal.     Nose: Nose normal.     Mouth/Throat:     Pharynx: No oropharyngeal exudate.  Eyes:     General: Lids  are normal. No scleral icterus.       Right eye: No discharge.        Left eye: No discharge.     Conjunctiva/sclera: Conjunctivae normal.     Right eye: Right conjunctiva is not injected. No exudate or hemorrhage.    Left eye: Left conjunctiva is not injected. No exudate or hemorrhage.    Pupils: Pupils are equal, round, and reactive to light.  Neck:     Thyroid: No thyroid mass or thyromegaly.     Vascular: Normal carotid pulses. No carotid bruit, hepatojugular reflux or JVD.     Trachea: Trachea and phonation normal. No tracheal tenderness or tracheal deviation.     Meningeal: Brudzinski's sign and Kernig's sign absent.  Cardiovascular:     Rate and Rhythm: Normal rate and regular rhythm.     Pulses: Normal pulses.          Radial pulses are 2+ on the right side and 2+ on the left side.       Dorsalis pedis pulses are 2+ on the right side and 2+ on the left side.       Posterior tibial pulses are 2+ on the right side and 2+ on the left side.     Heart sounds: Normal heart sounds, S1 normal and S2 normal. Heart sounds not distant. No murmur heard.   No friction rub. No gallop.  Pulmonary:     Effort: Pulmonary effort is normal. No tachypnea, bradypnea, accessory muscle usage or respiratory distress. She is not intubated.     Breath sounds: Normal breath sounds. No stridor. No wheezing, rhonchi or rales.  Chest:     Chest wall: No tenderness.  Abdominal:     General: Bowel sounds are normal. There is no distension or abdominal bruit.     Palpations: Abdomen is soft. There is no shifting dullness, fluid wave, hepatomegaly, splenomegaly, mass or pulsatile mass.     Tenderness: There is no abdominal tenderness. There is no guarding or rebound.     Hernia: No hernia is present.  Musculoskeletal:        General: No tenderness or deformity. Normal range of motion.     Cervical back: Full passive range of motion without pain, normal range of motion and neck supple. No edema, erythema or  rigidity. No spinous process tenderness or muscular tenderness. Normal range of motion.  Lymphadenopathy:     Head:     Right  side of head: No submental, submandibular, tonsillar, preauricular, posterior auricular or occipital adenopathy.     Left side of head: No submental, submandibular, tonsillar, preauricular, posterior auricular or occipital adenopathy.     Cervical: No cervical adenopathy.     Right cervical: No superficial, deep or posterior cervical adenopathy.    Left cervical: No superficial, deep or posterior cervical adenopathy.     Upper Body:     Right upper body: No supraclavicular or pectoral adenopathy.     Left upper body: No supraclavicular or pectoral adenopathy.  Skin:    General: Skin is warm and dry.     Coloration: Skin is not pale.     Findings: No abrasion, bruising, burn, ecchymosis, erythema, lesion, petechiae or rash.     Nails: There is no clubbing.  Neurological:     Mental Status: She is alert and oriented to person, place, and time.     GCS: GCS eye subscore is 4. GCS verbal subscore is 5. GCS motor subscore is 6.     Cranial Nerves: No cranial nerve deficit.     Sensory: No sensory deficit.     Motor: No tremor, atrophy, abnormal muscle tone or seizure activity.     Coordination: Coordination normal.     Gait: Gait normal.     Deep Tendon Reflexes: Reflexes are normal and symmetric. Reflexes normal. Babinski sign absent on the right side. Babinski sign absent on the left side.     Reflex Scores:      Tricep reflexes are 2+ on the right side and 2+ on the left side.      Bicep reflexes are 2+ on the right side and 2+ on the left side.      Brachioradialis reflexes are 2+ on the right side and 2+ on the left side.      Patellar reflexes are 2+ on the right side and 2+ on the left side.      Achilles reflexes are 2+ on the right side and 2+ on the left side. Psychiatric:        Speech: Speech normal.        Behavior: Behavior normal.        Thought  Content: Thought content normal.        Judgment: Judgment normal.    BP 128/74    Pulse 88    Temp (!) 95.9 F (35.5 C)    Ht 5' 2.99" (1.6 m)    Wt 294 lb 3.2 oz (133.4 kg)    LMP 09/03/2021 (Exact Date)    SpO2 97%    BMI 52.13 kg/m  Wt Readings from Last 3 Encounters:  09/26/21 294 lb 3.2 oz (133.4 kg)  09/12/21 300 lb (136.1 kg)  09/03/21 296 lb 6.4 oz (134.4 kg)    Health Maintenance Due  Topic Date Due   TETANUS/TDAP  Never done   PAP SMEAR-Modifier  Never done   COVID-19 Vaccine (4 - Booster for Pfizer series) 01/15/2021    There are no preventive care reminders to display for this patient.   Lab Results  Component Value Date   TSH 2.93 02/15/2021   Lab Results  Component Value Date   WBC 7.2 07/23/2021   HGB 12.9 07/23/2021   HCT 39.5 07/23/2021   MCV 89.0 07/23/2021   PLT 359 07/23/2021   Lab Results  Component Value Date   NA 139 02/15/2021   K 4.7 02/15/2021   CO2 27 02/15/2021   GLUCOSE 109 (  H) 02/15/2021   BUN 10 02/15/2021   CREATININE 0.92 02/15/2021   BILITOT 0.6 02/15/2021   ALKPHOS 68 02/15/2021   AST 10 02/15/2021   ALT 8 02/15/2021   PROT 6.8 02/15/2021   ALBUMIN 4.0 02/15/2021   CALCIUM 9.0 02/15/2021   GFR 74.35 02/15/2021   Lab Results  Component Value Date   CHOL 242 (H) 02/15/2021   Lab Results  Component Value Date   HDL 34.70 (L) 02/15/2021   Lab Results  Component Value Date   LDLCALC 180 (H) 02/15/2021   Lab Results  Component Value Date   TRIG 137.0 02/15/2021   Lab Results  Component Value Date   CHOLHDL 7 02/15/2021   No results found for: HGBA1C     Assessment & Plan:   Problem List Items Addressed This Visit       Nervous and Auditory   Multiple sclerosis ([place])     Other   Fatigue   Relevant Orders   IBC + Ferritin   Other Visit Diagnoses     Hematuria, unspecified type- was on menses.     -  Primary   Relevant Orders   Urine Culture   Urinalysis, Routine w reflex microscopic   CBC with  Differential/Platelet   Comprehensive metabolic panel   Y18   Body mass index (BMI) of 50-59.9 in adult [facility])         Suspected the patient on her menses, she denies any abnormal bleeding.  She will come back in 1 week after her menses have a recheck of her urine.  She was referred to gynecology for Pap and mammogram at the last visit she still plans to go to gynecology.   she will have her urine rechecked and we will see if she needs urology.  Advised to come in for her urine check and she is not on her menses.  No symptoms of kidney stones.  She feels well otherwise and reports that her menses have always been around 1.5 weeks for her entire life.   Advised patient call the office or your primary care doctor for an appointment if no improvement within 72 hours or if any symptoms change or worsen at any time  Advised ER or urgent Care if after hours or on weekend. Call 911 for emergency symptoms at any time.Patinet verbalized understanding of all instructions given/reviewed and treatment plan and has no further questions or concerns at this time.    Return if symptoms worsen or fail to improve, for at any time for any worsening symptoms, Go to Emergency room/ urgent care if worse.   No orders of the defined types were placed in this encounter.    Marcille Buffy, FNP

## 2021-09-28 ENCOUNTER — Other Ambulatory Visit (INDEPENDENT_AMBULATORY_CARE_PROVIDER_SITE_OTHER): Payer: 59

## 2021-09-28 ENCOUNTER — Telehealth: Payer: Self-pay

## 2021-09-28 DIAGNOSIS — E785 Hyperlipidemia, unspecified: Secondary | ICD-10-CM

## 2021-09-28 LAB — HEMOGLOBIN A1C: Hgb A1c MFr Bld: 5.3 % (ref 4.6–6.5)

## 2021-09-28 NOTE — Telephone Encounter (Signed)
Placed orders for A1C for future labs

## 2021-09-28 NOTE — Progress Notes (Signed)
Hemoglobin A1c 5.3 within normal range.  Nondiabetic.

## 2021-09-30 ENCOUNTER — Other Ambulatory Visit: Payer: Self-pay

## 2021-09-30 ENCOUNTER — Ambulatory Visit
Admission: RE | Admit: 2021-09-30 | Discharge: 2021-09-30 | Disposition: A | Discharge status: Home / Self Care | Payer: 59 | Source: Ambulatory Visit | Attending: Neurology | Admitting: Neurology

## 2021-09-30 DIAGNOSIS — G35 Multiple sclerosis: Secondary | ICD-10-CM | POA: Diagnosis not present

## 2021-09-30 IMAGING — MR MR HEAD W/O CM
14 series · 48 of 48 positions shown · non-contrast
Comparison: None.

CLINICAL DATA: History of multiple sclerosis. Balance disturbance.
Thought disturbance and speech disturbance.

EXAM:
MRI HEAD WITHOUT CONTRAST
TECHNIQUE: Multiplanar, multiecho pulse sequences of the brain and surrounding
structures were obtained without intravenous contrast.

[Series 5: ax dwi_tracew · axial · 3.0mm · 0.65mm/px · z∈[-80,+75]mm · 2 of 48 slices shown]
[im 1/48]
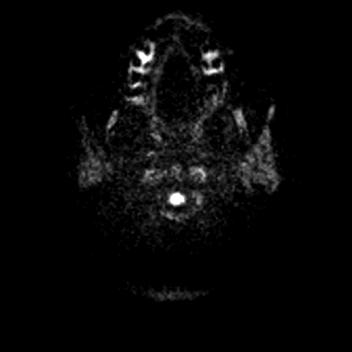
[im 48/48]
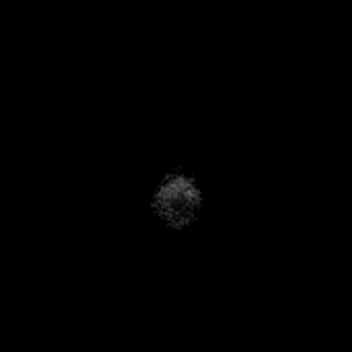

[Series 6: ax dwi_adc · axial · 3.0mm · 0.65mm/px · z∈[-80,+72]mm · 3 of 47 slices shown]
[im 1/47]
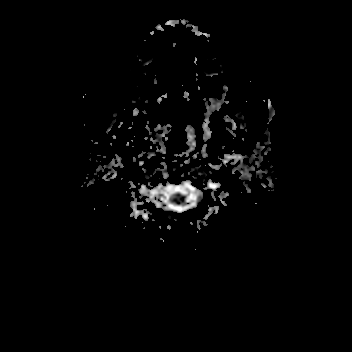
[im 24/47]
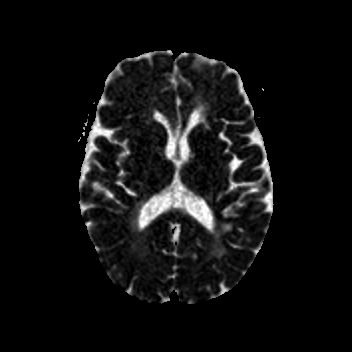
[im 47/47]
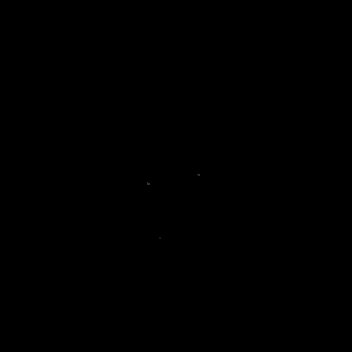

[Series 7: cor dwi_tracew · coronal · 5.0mm · 0.60mm/px · 2 of 38 slices shown]
[im 1/38]
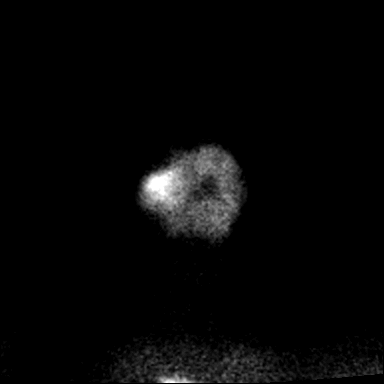
[im 38/38]
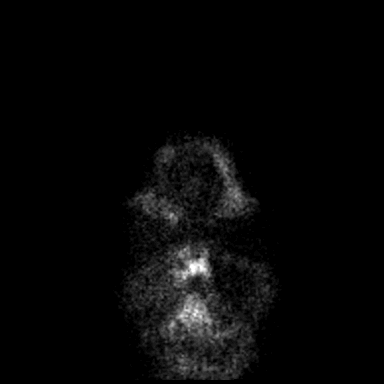

[Series 8: cor dwi_adc · coronal · 5.0mm · 0.60mm/px · 2 of 33 slices shown]
[im 1/33]
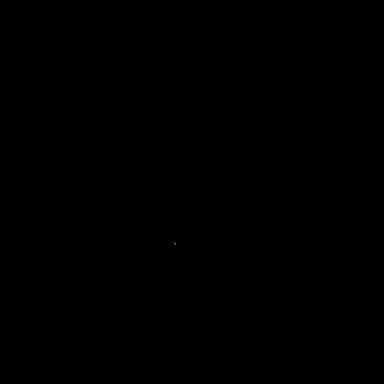
[im 33/33]
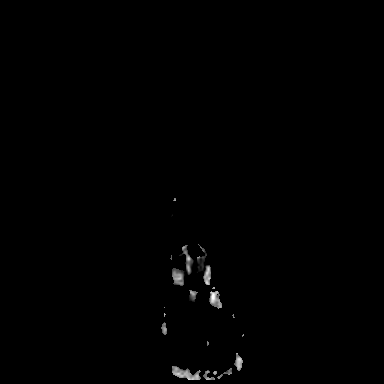

[Series 9: T1 · sagittal · 5.0mm · 0.62mm/px · 2 of 25 slices shown (1 of 2)]
[im 1/25]
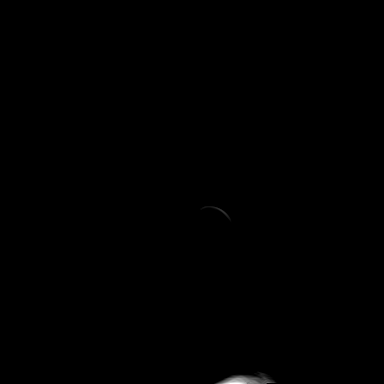
[im 25/25]
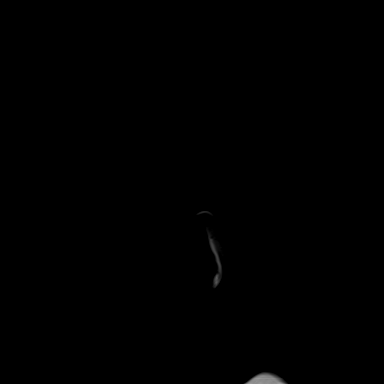

[Series 10: T2 · axial · 5.0mm · 0.53mm/px · z∈[-75,+69]mm · 2 of 25 slices shown (1 of 2)]
[im 1/25]
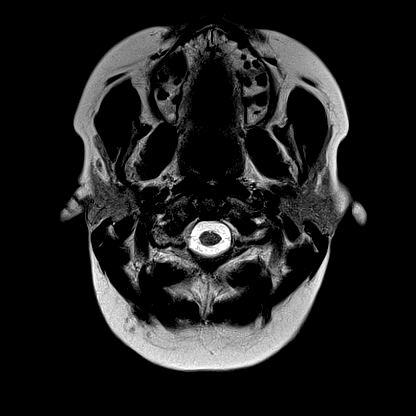
[im 25/25]
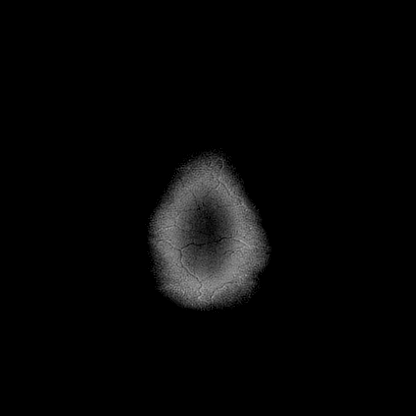

[Series 11: mag_images · axial · 3.0mm · 0.90mm/px · z∈[-91,+86]mm · 4 of 60 slices shown]
[im 1/60]
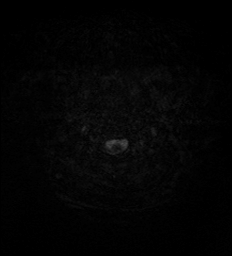
[im 20/60]
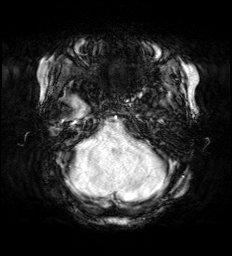
[im 40/60]
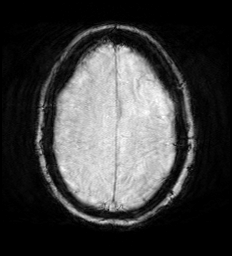
[im 60/60]
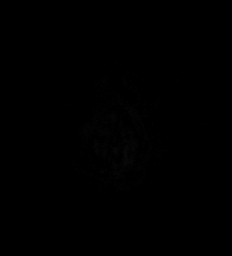

[Series 12: pha_images · axial · 3.0mm · 0.90mm/px · z∈[-91,+86]mm · 4 of 60 slices shown]
[im 1/60]
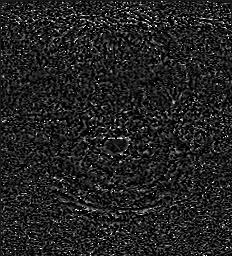
[im 20/60]
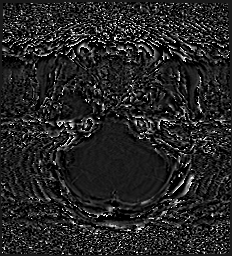
[im 40/60]
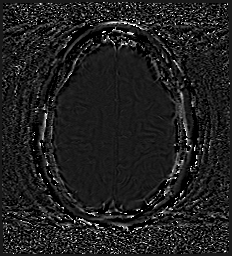
[im 60/60]
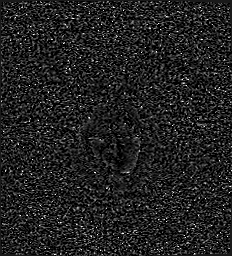

[Series 13: swi_images · axial · 3.0mm · 0.90mm/px · z∈[-91,+86]mm · 4 of 60 slices shown]
[im 1/60]
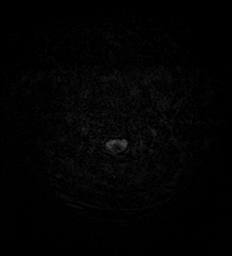
[im 20/60]
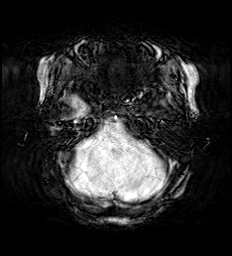
[im 40/60]
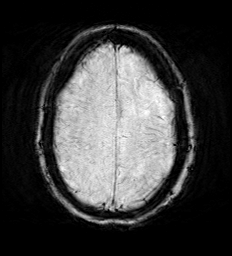
[im 60/60]
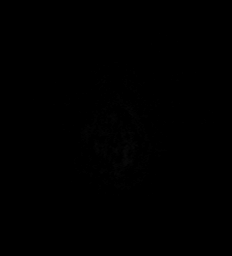

[Series 14: mip_images(sw) · axial · 24.0mm · 0.90mm/px · z∈[-80,+75]mm · 3 of 53 slices shown]
[im 1/53]
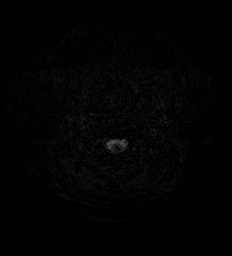
[im 27/53]
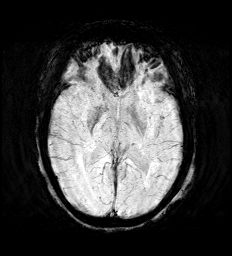
[im 53/53]
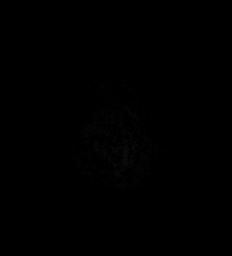

[Series 15: FLAIR · sagittal · 5.0mm · 0.94mm/px · 2 of 25 slices shown (1 of 2)]
[im 1/25]
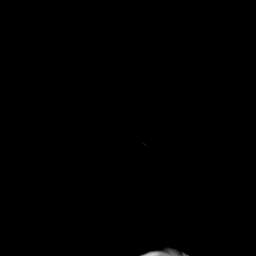
[im 25/25]
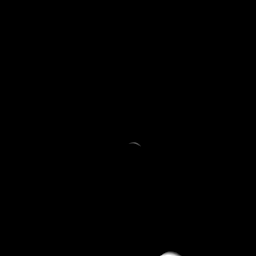

[Series 16: FLAIR · axial · 3.0mm · 0.53mm/px · z∈[-83,+78]mm · 4 of 55 slices shown (2 of 2)]
[im 1/55]
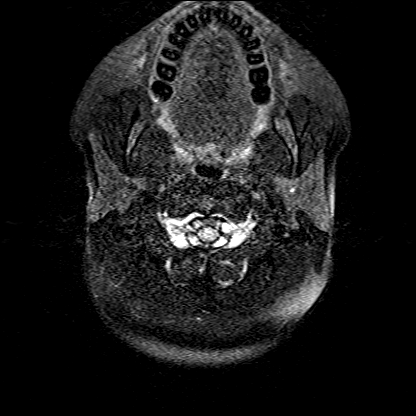
[im 19/55]
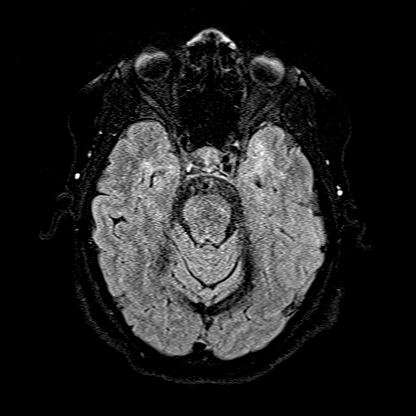
[im 37/55]
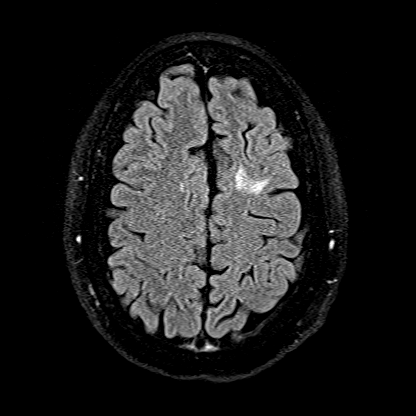
[im 55/55]
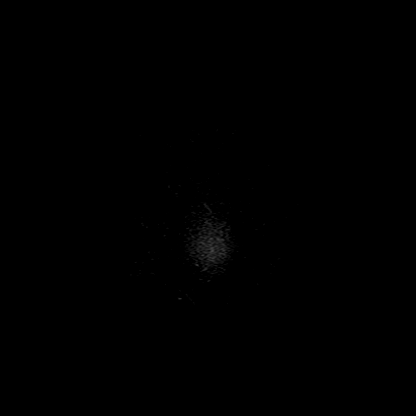

[Series 17: T1 · axial · 1.0mm · 0.98mm/px · z∈[-80,+93]mm · 12 of 176 slices shown (2 of 2)]
[im 1/176]
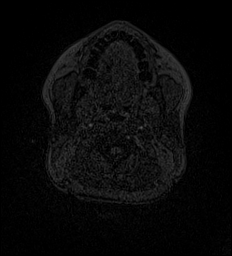
[im 16/176]
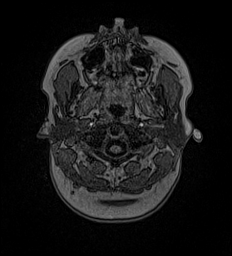
[im 32/176]
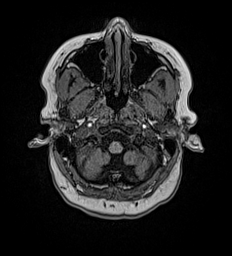
[im 48/176]
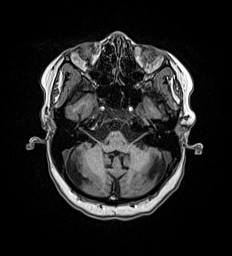
[im 64/176]
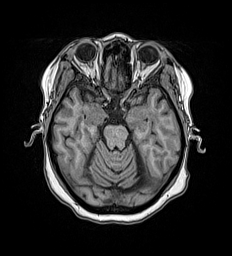
[im 80/176]
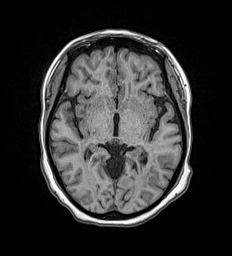
[im 96/176]
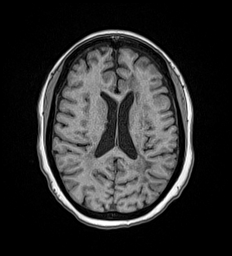
[im 112/176]
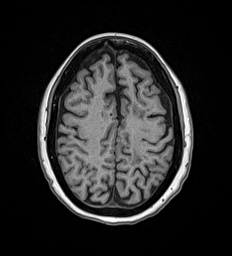
[im 128/176]
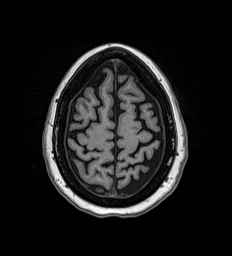
[im 144/176]
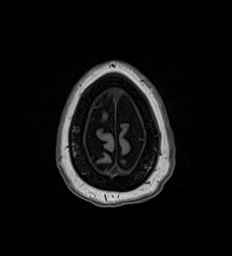
[im 160/176]
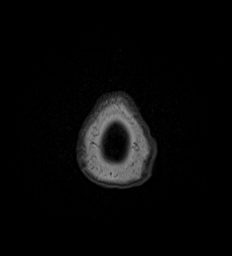
[im 176/176]
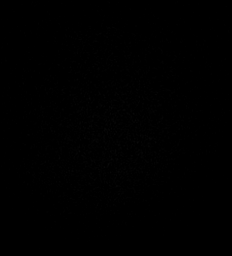

[Series 18: T2 · coronal · 5.0mm · 0.57mm/px · 2 of 29 slices shown (2 of 2)]
[im 1/29]
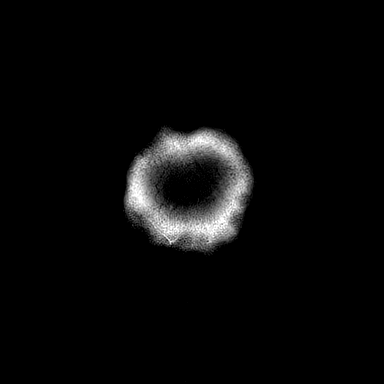
[im 29/29]
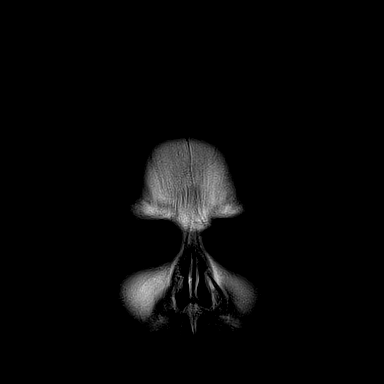

[48 of 48 positions shown; findings below may reference images not displayed]

FINDINGS: Brain: Diffusion imaging does not show any acute or subacute
infarction or other cause of restricted diffusion. Probably chronic
abnormal T2 and FLAIR signal within the right lateral margin of the
pons. Chronic T2 and FLAIR signal within the cerebellar peduncles,
left more than right. Cerebral hemispheres show likely chronic white
matter disease within the cerebral hemispheric white matter, more
extensive in the left hemisphere than the right, consistent with the
clinical diagnosis of demyelinating disease. No cortical
involvement. No mass lesion, hemorrhage, hydrocephalus or
extra-axial collection.

Vascular: Major vessels at the base of the brain show flow.

Skull and upper cervical spine: Negative

Sinuses/Orbits: Clear/normal

Other: None
IMPRESSION: Presumed chronic abnormal T2 and FLAIR signal affecting the
brainstem, cerebellum and cerebral hemispheric white matter
consistent with the clinical diagnosis of multiple sclerosis.
Cerebral hemispheric involvement is asymmetrically more extensive in
the left hemisphere than the right. No lesion shows restricted
diffusion or swelling to suggest an active nature.

## 2021-09-30 IMAGING — MR MR CERVICAL SPINE W/O CM
6 series · 40 of 48 positions shown · non-contrast
Comparison: None.

CLINICAL DATA: History of multiple sclerosis

EXAM:
MRI CERVICAL SPINE WITHOUT CONTRAST
TECHNIQUE: Multiplanar, multisequence MR imaging of the cervical spine was
performed. No intravenous contrast was administered.

[Series 5: T2 · sagittal · 3.0mm · 0.62mm/px · 6 of 15 slices shown (1 of 3)]
[im 1/15]
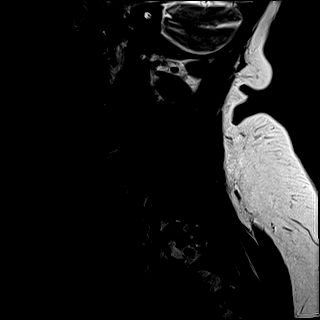
[im 3/15]
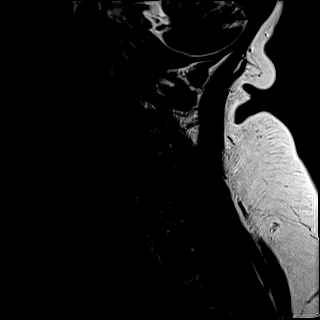
[im 6/15]
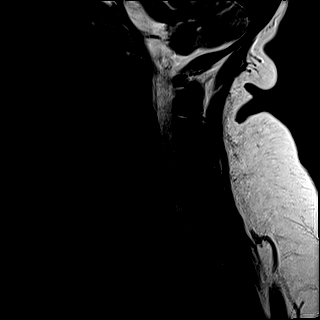
[im 9/15]
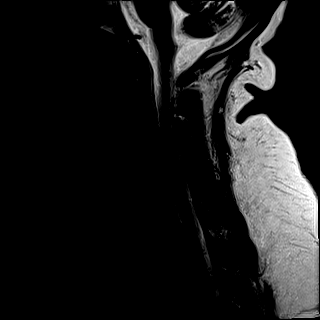
[im 12/15]
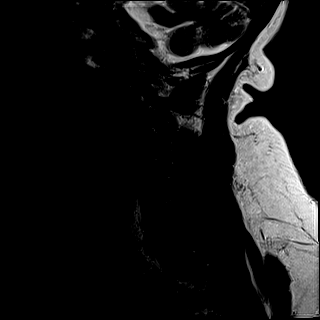
[im 15/15]
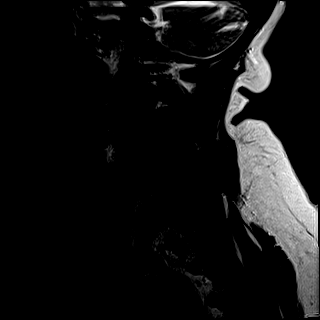

[Series 6: T2 · sagittal · 3.0mm · 0.62mm/px · 6 of 15 slices shown (2 of 3)]
[im 1/15]
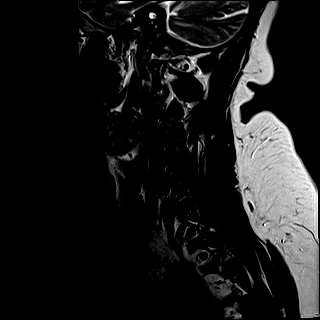
[im 3/15]
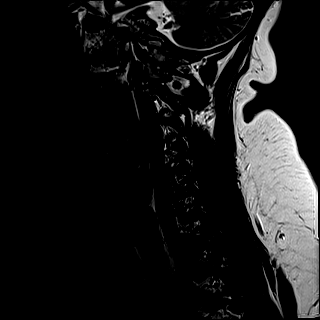
[im 6/15]
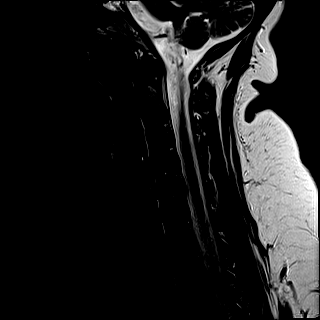
[im 9/15]
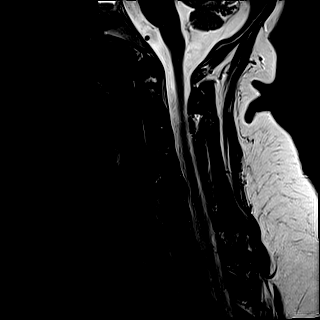
[im 12/15]
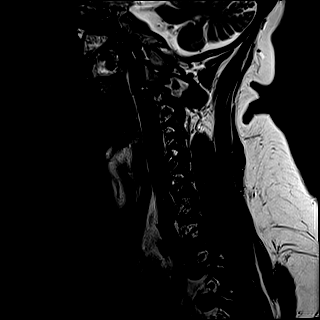
[im 15/15]
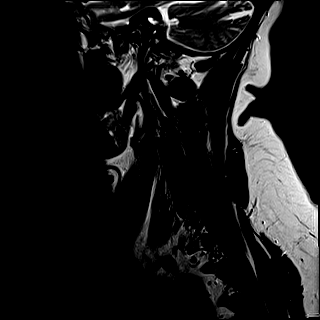

[Series 7: FLAIR · sagittal · 3.0mm · 0.78mm/px · 6 of 15 slices shown]
[im 1/15]
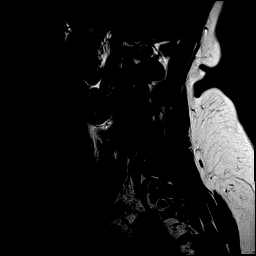
[im 3/15]
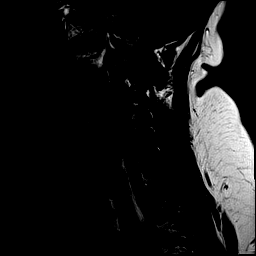
[im 6/15]
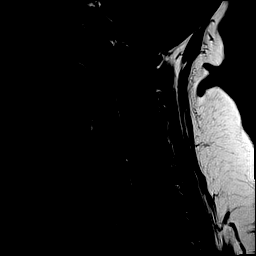
[im 9/15]
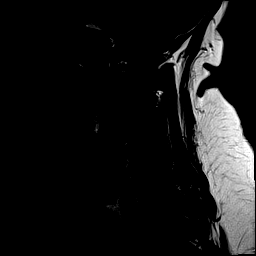
[im 12/15]
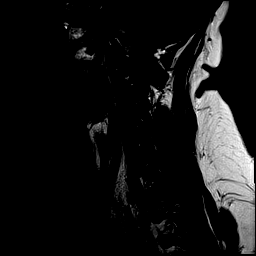
[im 15/15]
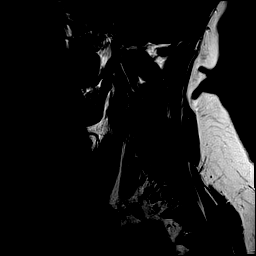

[Series 8: STIR · sagittal · 3.0mm · 0.62mm/px · 6 of 15 slices shown]
[im 1/15]
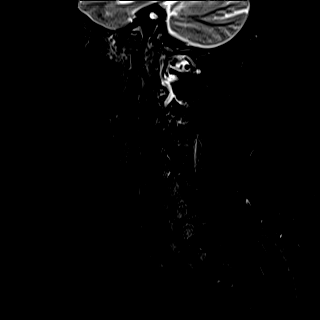
[im 3/15]
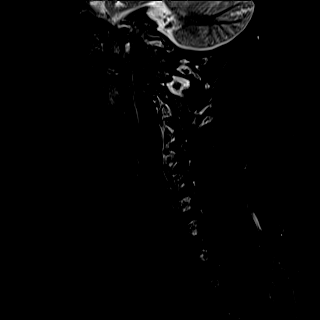
[im 6/15]
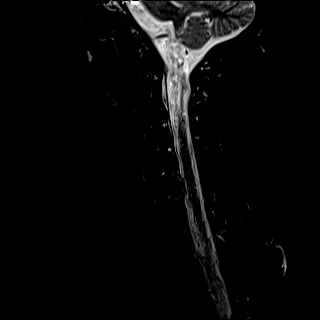
[im 9/15]
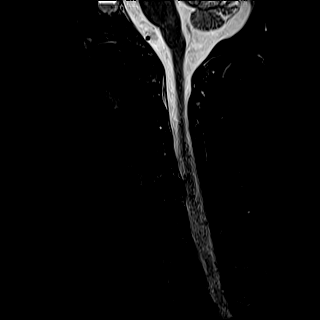
[im 12/15]
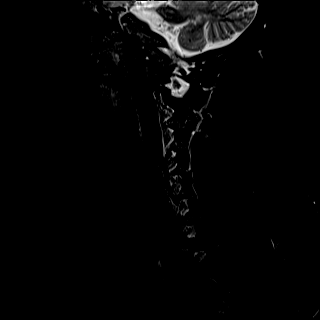
[im 15/15]
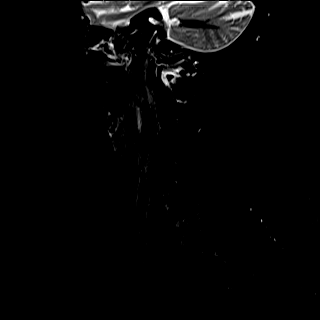

[Series 9: T2 · axial · 3.0mm · 0.70mm/px · z∈[-211,-114]mm · 9 of 29 slices shown (3 of 3)]
[im 1/29]
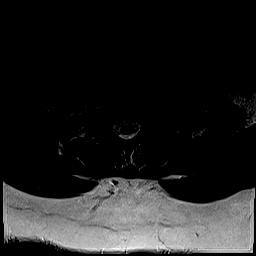
[im 6/29]
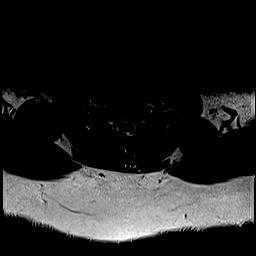
[im 8/29]
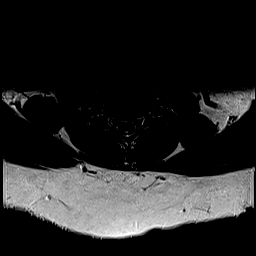
[im 13/29]
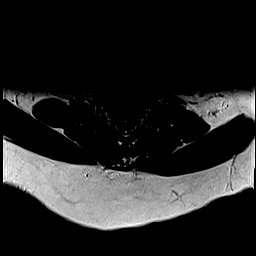
[im 16/29]
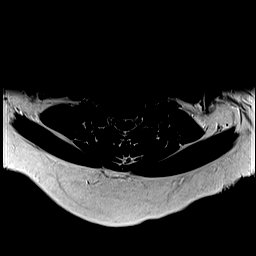
[im 21/29]
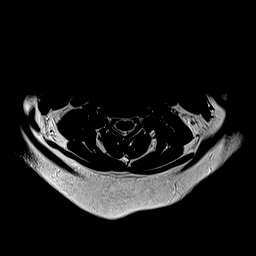
[im 23/29]
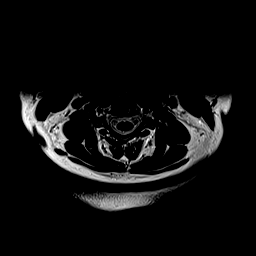
[im 26/29]
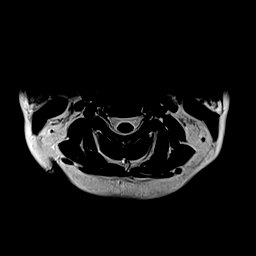
[im 29/29]
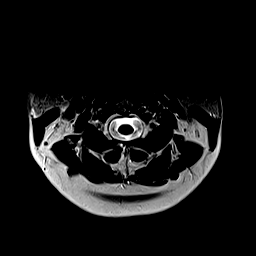

[Series 10: ax mpgr · axial · 3.0mm · 0.35mm/px · z∈[-211,-135]mm · 7 of 29 slices shown]
[im 1/29]
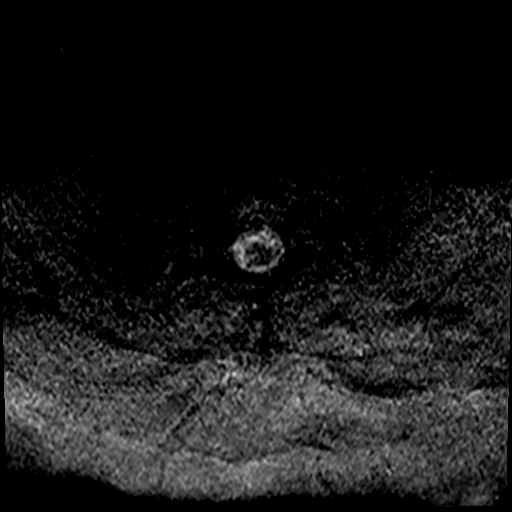
[im 6/29]
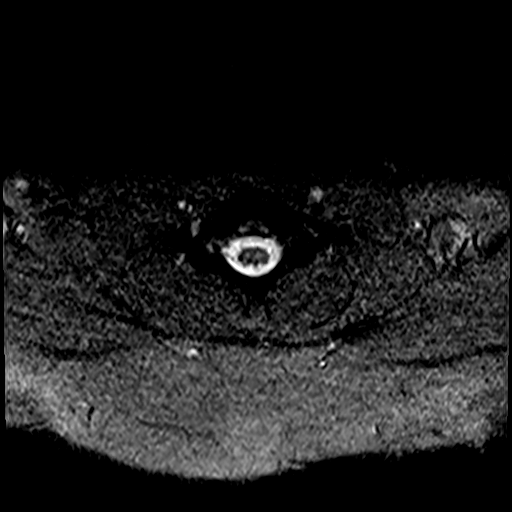
[im 8/29]
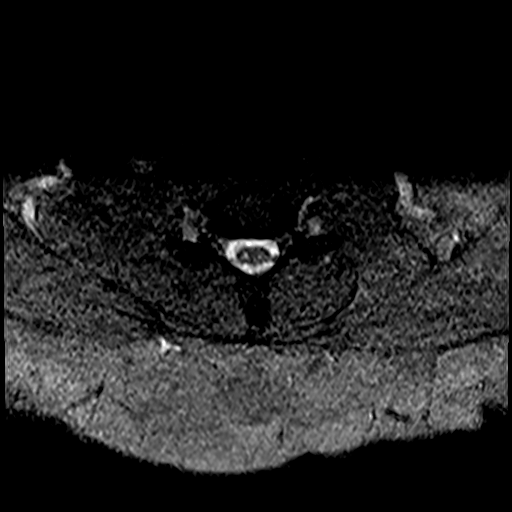
[im 13/29]
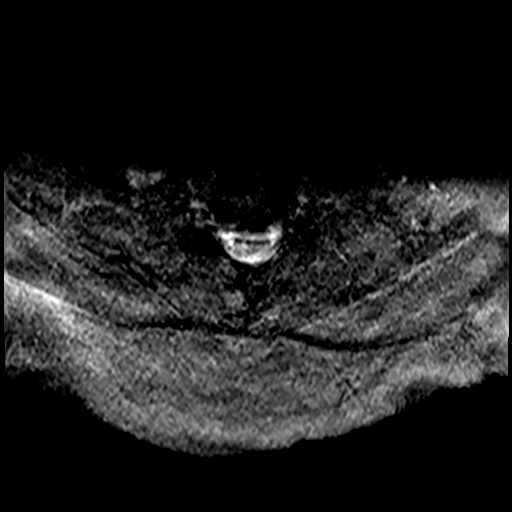
[im 16/29]
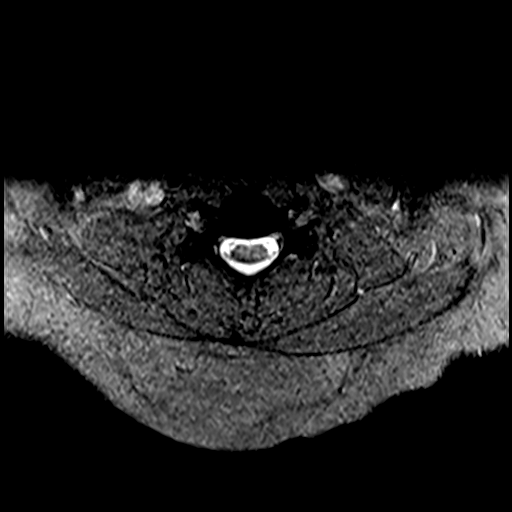
[im 21/29]
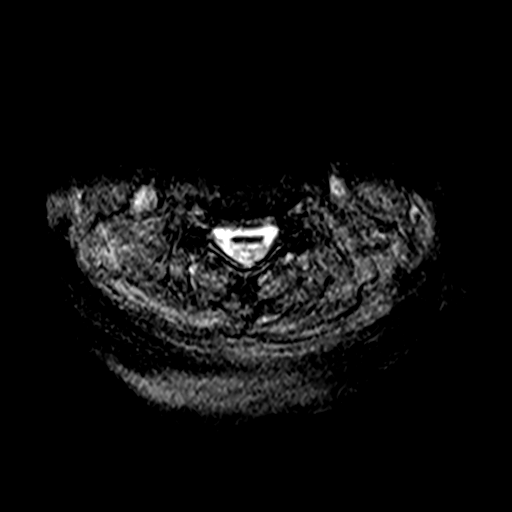
[im 23/29]
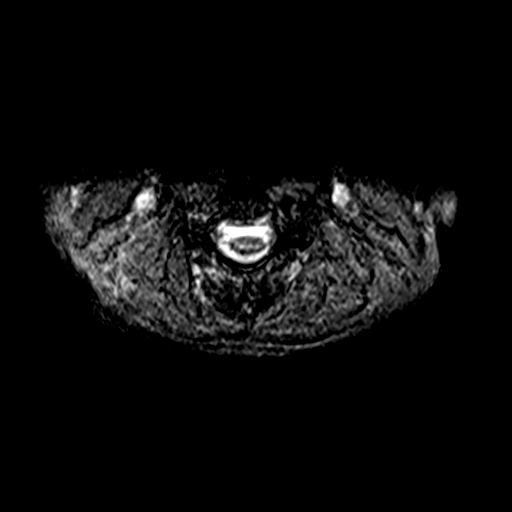

[40 of 48 positions shown; findings below may reference images not displayed]

FINDINGS: Alignment: Straightening of the cervical lordosis without static
listhesis.

Vertebrae: No fracture, evidence of discitis, or bone lesion.

Cord: No focal cord signal abnormality or evidence of cord lesion.
Normal morphology.

Posterior Fossa, vertebral arteries, paraspinal tissues: Please see
dedicated MRI of the brain for evaluation of the posterior fossa.
Vertebral artery flow voids are preserved. No paraspinal
abnormality.

Disc levels:

C2-C3: Unremarkable.

C3-C4: Unremarkable.

C4-C5: Unremarkable.

C5-C6: Shallow left paracentral disc protrusion. Unremarkable facet
joints. No foraminal or canal stenosis.

C6-C7: Unremarkable.

C7-T1: Unremarkable.
IMPRESSION: 1. No focal cord signal abnormality or evidence of cord lesion.
2. Shallow non-compressive left paracentral disc protrusion at
C5-C6. No foraminal or canal stenosis at any level.

## 2021-10-26 ENCOUNTER — Ambulatory Visit (INDEPENDENT_AMBULATORY_CARE_PROVIDER_SITE_OTHER): Payer: 59 | Admitting: Obstetrics and Gynecology

## 2021-10-26 ENCOUNTER — Encounter: Payer: Self-pay | Admitting: Obstetrics and Gynecology

## 2021-10-26 ENCOUNTER — Other Ambulatory Visit: Payer: Self-pay

## 2021-10-26 VITALS — BP 163/86 | HR 84 | Ht 63.0 in | Wt 298.0 lb

## 2021-10-26 DIAGNOSIS — Z7689 Persons encountering health services in other specified circumstances: Secondary | ICD-10-CM

## 2021-10-26 NOTE — Progress Notes (Signed)
HPI:      [name] is a 46 y.o. G0P0000 who LMP was Patient's last menstrual period was 10/22/2021.  Subjective:   She presents today to [facility] care [facility].  She is declining examination today because she is on her menstrual period.  She is currently taking combination OCPs for cycle regulation.  She describes her periods as moderate to heavy lasting 7 to 8 days.  She describes at least 2 previous pelvic surgeries the last one being approximately 2 years ago.  She also states that she had uterine fibroids and she thinks she had them "removed".  But when further questioned she thinks maybe it was something to do with her ovaries and not her uterus. She reports that she is up-to-date on mammography and Pap smears.  She states that she had both of those 1 year ago. She would like to return when she is not on her menstrual period.    Hx: The following portions of the patient's history were reviewed and updated as appropriate:             She  has a past medical history of Anemia of unknown etiology (02/28/2016), Chicken pox, Depression, Frequent headaches, Glaucoma, Multiple sclerosis ([place]), Sleep apnea, and Urine incontinence. She does not have any pertinent problems on file. She  has a past surgical history that includes Uterine fibroid surgery and Partial hysterectomy. Her family history includes Depression in her mother; Diabetes in her mother and sister; Hyperlipidemia in her mother; Hypertension in her mother and sister; Uterine cancer in her mother. She  reports that she has never smoked. She has never used smokeless tobacco. She reports that she does not drink alcohol and does not use drugs. She has a current medication list which includes the following prescription(s): baclofen, cryselle-28, ferosul, gilenya, lumigan, vitamin b-12, and vitamin d (ergocalciferol). She has No Known Allergies.       Review of Systems:  Review of Systems  Constitutional: Denied  constitutional symptoms, night sweats, recent illness, fatigue, fever, insomnia and weight loss.  Eyes: Denied eye symptoms, eye pain, photophobia, vision change and visual disturbance.  Ears/Nose/Throat/Neck: Denied ear, nose, throat or neck symptoms, hearing loss, nasal discharge, sinus congestion and sore throat.  Cardiovascular: Denied cardiovascular symptoms, arrhythmia, chest pain/pressure, edema, exercise intolerance, orthopnea and palpitations.  Respiratory: Denied pulmonary symptoms, asthma, pleuritic pain, productive sputum, cough, dyspnea and wheezing.  Gastrointestinal: Denied, gastro-esophageal reflux, melena, nausea and vomiting.  Genitourinary: Denied genitourinary symptoms including symptomatic vaginal discharge, pelvic relaxation issues, and urinary complaints.  Musculoskeletal: Denied musculoskeletal symptoms, stiffness, swelling, muscle weakness and myalgia.  Dermatologic: Denied dermatology symptoms, rash and scar.  Neurologic: Denied neurology symptoms, dizziness, headache, neck pain and syncope.  Psychiatric: Denied psychiatric symptoms, anxiety and depression.  Endocrine: Denied endocrine symptoms including hot flashes and night sweats.   Meds:   Current Outpatient Medications on File Prior to Visit  Medication Sig Dispense Refill   baclofen (LIORESAL) 10 MG tablet Take 10 mg by mouth 3 (three) times daily as needed.     CRYSELLE-28 0.3-30 MG-MCG tablet Take 1 tablet by mouth daily. 28 tablet 1   FEROSUL 325 (65 Fe) MG tablet Take 325 mg by mouth daily.     GILENYA 0.5 MG CAPS Take 1 capsule by mouth daily.     LUMIGAN 0.01 % SOLN 1 drop at bedtime.     vitamin B-12 (CYANOCOBALAMIN) 1000 MCG tablet Take 1 tablet (1,000 mcg total) by mouth daily. [place]  tablet 1   Vitamin D, Ergocalciferol, (DRISDOL) 1.25 MG (50000 UNIT) CAPS capsule Take 1 capsule (50,000 Units total) by mouth every 7 (seven) days. (taking one tablet per week) scheduled Vitamin D lab in  1-2 weeks after  completing prescription. 12 capsule 0   No current facility-administered medications on file prior to visit.      Objective:     Vitals:   10/26/21 1004  BP: (!) 163/86  Pulse: 84   Filed Weights   10/26/21 1004  Weight: 298 lb (135.2 kg)              Declined          Assessment:    G0P0000 Patient Active Problem List   Diagnosis Date Noted   Hyperlipidemia 09/03/2021   Multiple sclerosis ([place]) 09/03/2021   Oral contraceptive pill surveillance 09/03/2021   Vitamin D deficiency 02/15/2021   Need for diphtheria-tetanus-pertussis (Tdap) vaccine 02/15/2021   Healthcare maintenance 02/15/2021   Fatigue 02/15/2021   Class 3 severe obesity without serious comorbidity with body mass index (BMI) of 50.0 to 59.9 in adult ([place]) 02/15/2021     1. Encounter to establish care        Plan:            1.  Patient to sign paperwork to obtain operative dictation from previous surgery.  2.  Perform annual examination in approximately 1 month.  Patient to return when not on menses. Orders No orders of the defined types were placed in this encounter.   No orders of the defined types were placed in this encounter.     F/U  No follow-ups on file. I spent 32 minutes involved in the care of this patient preparing to see the patient by obtaining and reviewing her medical history (including labs, imaging tests and prior procedures), documenting clinical information in the electronic health record (EHR), counseling and coordinating care plans, writing and sending prescriptions, ordering tests or procedures and in direct communicating with the patient and medical staff discussing pertinent items from her history and physical exam.  Finis Bud, M.D. 10/26/2021 11:05 AM

## 2021-10-31 MED ORDER — NA SULFATE-K SULFATE-MG SULF 17.5-3.13-1.6 GM/177ML PO SOLN: 354.0000 mL | Freq: Once | ORAL | 0 refills | Status: AC

## 2021-10-31 NOTE — Addendum Note (Signed)
Addended by: Wayna Chalet on: 10/31/2021 08:45 AM   Modules accepted: Orders

## 2021-11-01 ENCOUNTER — Encounter
Admission: RE | Disposition: A | Discharge status: Home / Self Care | Payer: Self-pay | Source: Home / Self Care | Attending: Gastroenterology

## 2021-11-01 ENCOUNTER — Other Ambulatory Visit: Payer: Self-pay

## 2021-11-01 ENCOUNTER — Ambulatory Visit: Payer: 59 | Admitting: Anesthesiology

## 2021-11-01 ENCOUNTER — Ambulatory Visit
Admission: RE | Admit: 2021-11-01 | Discharge: 2021-11-01 | Disposition: A | Discharge status: Home / Self Care | Payer: 59 | Attending: Gastroenterology | Admitting: Gastroenterology

## 2021-11-01 ENCOUNTER — Encounter: Payer: Self-pay | Admitting: Gastroenterology

## 2021-11-01 DIAGNOSIS — D120 Benign neoplasm of cecum: Secondary | ICD-10-CM | POA: Insufficient documentation

## 2021-11-01 DIAGNOSIS — E611 Iron deficiency: Secondary | ICD-10-CM

## 2021-11-01 DIAGNOSIS — K635 Polyp of colon: Secondary | ICD-10-CM

## 2021-11-01 DIAGNOSIS — D509 Iron deficiency anemia, unspecified: Secondary | ICD-10-CM | POA: Diagnosis present

## 2021-11-01 DIAGNOSIS — D124 Benign neoplasm of descending colon: Secondary | ICD-10-CM | POA: Insufficient documentation

## 2021-11-01 DIAGNOSIS — G4730 Sleep apnea, unspecified: Secondary | ICD-10-CM | POA: Insufficient documentation

## 2021-11-01 DIAGNOSIS — Z1211 Encounter for screening for malignant neoplasm of colon: Secondary | ICD-10-CM

## 2021-11-01 HISTORY — PX: ESOPHAGOGASTRODUODENOSCOPY: SHX5428

## 2021-11-01 HISTORY — PX: COLONOSCOPY WITH PROPOFOL: SHX5780

## 2021-11-01 SURGERY — COLONOSCOPY WITH PROPOFOL
Anesthesia: General

## 2021-11-01 MED ORDER — PROPOFOL 500 MG/50ML IV EMUL
INTRAVENOUS | Status: AC
  Filled 2021-11-01: qty 50

## 2021-11-01 MED ORDER — PHENYLEPHRINE HCL-NACL 20-0.9 MG/250ML-% IV SOLN
INTRAVENOUS | Status: AC
  Filled 2021-11-01: qty 250

## 2021-11-01 MED ORDER — PROPOFOL 10 MG/ML IV BOLUS
INTRAVENOUS | Status: DC | PRN
Start: 2021-11-01 — End: 2021-11-01
  Administered 2021-11-01 (×4): 20 mg via INTRAVENOUS
  Administered 2021-11-01: 60 mg via INTRAVENOUS

## 2021-11-01 MED ORDER — PHENYLEPHRINE HCL (PRESSORS) 10 MG/ML IV SOLN
INTRAVENOUS | Status: AC
  Filled 2021-11-01: qty 1

## 2021-11-01 MED ORDER — SODIUM CHLORIDE 0.9 % IV SOLN: INTRAVENOUS | Status: DC

## 2021-11-01 MED ORDER — LIDOCAINE HCL (CARDIAC) PF 100 MG/5ML IV SOSY
PREFILLED_SYRINGE | INTRAVENOUS | Status: DC | PRN
  Administered 2021-11-01: 50 mg via INTRAVENOUS

## 2021-11-01 MED ORDER — DEXMEDETOMIDINE (PRECEDEX) IN NS 20 MCG/5ML (4 MCG/ML) IV SYRINGE
PREFILLED_SYRINGE | INTRAVENOUS | Status: DC | PRN
  Administered 2021-11-01: 8 ug via INTRAVENOUS

## 2021-11-01 MED ORDER — LIDOCAINE HCL (PF) 2 % IJ SOLN
INTRAMUSCULAR | Status: AC
  Filled 2021-11-01: qty 5

## 2021-11-01 MED ORDER — PROPOFOL 500 MG/50ML IV EMUL
INTRAVENOUS | Status: DC | PRN
  Administered 2021-11-01: 150 ug/kg/min via INTRAVENOUS

## 2021-11-01 MED ORDER — PHENYLEPHRINE HCL (PRESSORS) 10 MG/ML IV SOLN
INTRAVENOUS | Status: DC | PRN
  Administered 2021-11-01: 160 ug via INTRAVENOUS

## 2021-11-01 NOTE — H&P (Signed)
Shelia Bellows, MD [address] [place], [place], [place], [id number] [place], [place], [place], [address] Phone: [phone]  Fax: [phone]  Primary Care Physician:  Shelia Beam, FNP   Pre-Procedure History & Physical: HPI:  Shelia Chapman is a 46 y.o. female is here for an endoscopy and colonoscopy    Past Medical History:  Diagnosis Date   Anemia of unknown etiology 02/28/2016   Chicken pox    Depression    Frequent headaches    Glaucoma    Multiple sclerosis ([place])    Sleep apnea    Urine incontinence     Past Surgical History:  Procedure Laterality Date   PARTIAL HYSTERECTOMY     UTERINE FIBROID SURGERY      Prior to Admission medications   Medication Sig Start Date End Date Taking? Authorizing Provider  baclofen (LIORESAL) 10 MG tablet Take 10 mg by mouth 3 (three) times daily as needed. 06/14/21  Yes [provider]  CRYSELLE-28 0.3-30 MG-MCG tablet Take 1 tablet by mouth daily. 09/03/21  Yes Chapman, Shelia Aline, FNP  FEROSUL 325 (65 Fe) MG tablet Take 325 mg by mouth daily. 02/08/21  Yes [provider]  GILENYA 0.5 MG CAPS Take 1 capsule by mouth daily. 02/08/21  Yes [provider]  vitamin B-12 (CYANOCOBALAMIN) 1000 MCG tablet Take 1 tablet (1,000 mcg total) by mouth daily. 03/03/21  Yes Shelia Server, MD  Vitamin D, Ergocalciferol, (DRISDOL) 1.25 MG (50000 UNIT) CAPS capsule Take 1 capsule (50,000 Units total) by mouth every 7 (seven) days. (taking one tablet per week) scheduled Vitamin D lab in  1-2 weeks after completing prescription. 02/20/21  Yes Chapman, Shelia Aline, FNP  LUMIGAN 0.01 % SOLN 1 drop at bedtime. 12/25/20   [provider]    Allergies as of 09/12/2021   (No Known Allergies)    Family History  Problem Relation Age of Onset   Depression Mother    Diabetes Mother    Hyperlipidemia Mother    Hypertension Mother    Uterine cancer Mother    Diabetes Sister    Hypertension Sister     Breast cancer Neg Hx     Social History   Socioeconomic History   Marital status: Single    Spouse name: Not on file   Number of children: Not on file   Years of education: Not on file   Highest education level: Not on file  Occupational History   Not on file  Tobacco Use   Smoking status: Never   Smokeless tobacco: Never  Vaping Use   Vaping Use: Never used  Substance and Sexual Activity   Alcohol use: Never   Drug use: Never   Sexual activity: Not Currently  Other Topics Concern   Not on file  Social History Narrative   Not on file   Social Determinants of Health   Financial Resource Strain: Not on file  Food Insecurity: Not on file  Transportation Needs: Not on file  Physical Activity: Not on file  Stress: Not on file  Social Connections: Not on file  Intimate Partner Violence: Not on file    Review of Systems: See HPI, otherwise negative ROS  Physical Exam: BP (!) 154/85    Pulse (!) 110    Temp (!) 96.9 F (36.1 C) (Temporal)    Resp 18    Ht 5\' 3"  (1.6 m)    Wt 133.4 kg    LMP 10/22/2021  SpO2 97%    BMI 52.08 kg/m  General:   Alert,  pleasant and cooperative in NAD Head:  Normocephalic and atraumatic. Neck:  Supple; no masses or thyromegaly. Lungs:  Clear throughout to auscultation, normal respiratory effort.    Heart:  +S1, +S2, Regular rate and rhythm, No edema. Abdomen:  Soft, nontender and nondistended. Normal bowel sounds, without guarding, and without rebound.   Neurologic:  Alert and  oriented x4;  grossly normal neurologically.  Impression/Plan: [name] is here for an endoscopy and colonoscopy  to be performed for  evaluation of iron deficiency anemia    Risks, benefits, limitations, and alternatives regarding endoscopy have been reviewed with the patient.  Questions have been answered.  All parties agreeable.   Shelia Bellows, MD  11/01/2021, 7:47 AM

## 2021-11-01 NOTE — Op Note (Signed)
[facility] Patient Name: Shelia Chapman Procedure Date: 11/01/2021 7:50 AM MRN: [id number] Account #: [id number] Date of Birth: Feb 13, 1976 Admit Type: Outpatient Age: 46 Room: [facility] ENDO ROOM 3 Gender: Female Note Status: Finalized Instrument Name: Altamese Cabal Endoscope [id number] Procedure:             Upper GI endoscopy Indications:           Iron deficiency anemia Providers:             Jonathon Bellows MD, MD Referring MD:          Kelby Aline. [name] (Referring MD) Medicines:             Monitored Anesthesia Care Complications:         No immediate complications. Procedure:             Pre-Anesthesia Assessment:                        - Prior to the procedure, a History and Physical was                         performed, and patient medications, allergies and                         sensitivities were reviewed. The patient's tolerance                         of previous anesthesia was reviewed.                        - The risks and benefits of the procedure and the                         sedation options and risks were discussed with the                         patient. All questions were answered and informed                         consent was obtained.                        - ASA Grade Assessment: II - A patient with mild                         systemic disease.                        After obtaining informed consent, the endoscope was                         passed under direct vision. Throughout the procedure,                         the patient's blood pressure, pulse, and oxygen                         saturations were monitored continuously. The Endoscope                         was  introduced through the mouth, and advanced to the                         third part of duodenum. The upper GI endoscopy was                         accomplished with ease. The patient tolerated the                         procedure well. Findings:      The  esophagus was normal.      The examined duodenum was normal.      The entire examined stomach was normal. Biopsies were taken with a cold       forceps for histology. Impression:            - Normal esophagus.                        - Normal examined duodenum.                        - Normal stomach. Biopsied. Recommendation:        - Await pathology results.                        - Perform a colonoscopy today. Procedure Code(s):     --- Professional ---                        [phone], Esophagogastroduodenoscopy, flexible,                         transoral; with biopsy, single or multiple Diagnosis Code(s):     --- Professional ---                        D50.9, Iron deficiency anemia, unspecified CPT copyright 2019 American Medical Association. All rights reserved. The codes documented in this report are preliminary and upon coder review may  be revised to meet current compliance requirements. Jonathon Bellows, MD Jonathon Bellows MD, MD 11/01/2021 8:00:17 AM This report has been signed electronically. Number of Addenda: 0 Note Initiated On: 11/01/2021 7:50 AM Estimated Blood Loss:  Estimated blood loss: none.      [facility]

## 2021-11-01 NOTE — Op Note (Signed)
[facility] Gastroenterology Patient Name: Shelia Chapman Procedure Date: 11/01/2021 7:51 AM MRN: [id number] Account #: [id number] Date of Birth: 1975/12/25 Admit Type: Outpatient Age: 46 Room: [facility] ENDO ROOM 3 Gender: Female Note Status: Finalized Instrument Name: Jasper Riling [id number] Procedure:             Colonoscopy Indications:           Iron deficiency anemia Providers:             Jonathon Bellows MD, MD Referring MD:          Kelby Aline. [name] (Referring MD) Medicines:             Monitored Anesthesia Care Complications:         No immediate complications. Procedure:             Pre-Anesthesia Assessment:                        - Prior to the procedure, a History and Physical was                         performed, and patient medications, allergies and                         sensitivities were reviewed. The patient's tolerance                         of previous anesthesia was reviewed.                        - The risks and benefits of the procedure and the                         sedation options and risks were discussed with the                         patient. All questions were answered and informed                         consent was obtained.                        After obtaining informed consent, the colonoscope was                         passed under direct vision. Throughout the procedure,                         the patient's blood pressure, pulse, and oxygen                         saturations were monitored continuously. The                         Colonoscope was introduced through the anus and                         advanced to the the cecum, identified by the                         appendiceal  orifice. The colonoscopy was technically                         difficult and complex due to significant looping. The                         patient tolerated the procedure well. The quality of                         the bowel preparation was  excellent. Findings:      The perianal and digital rectal examinations were normal.      Three sessile polyps were found in the rectum, descending colon and       cecum. The polyps were 5 to 8 mm in size. These polyps were removed with       a cold snare. Resection and retrieval were complete.      The exam was otherwise without abnormality on direct and retroflexion       views. Impression:            - Three 5 to 8 mm polyps in the rectum, in the                         descending colon and in the cecum, removed with a cold                         snare. Resected and retrieved.                        - The examination was otherwise normal on direct and                         retroflexion views. Recommendation:        - Discharge patient to home (with escort).                        - Resume previous diet.                        - Continue present medications.                        - Await pathology results.                        - Repeat colonoscopy for surveillance based on                         pathology results. Procedure Code(s):     --- Professional ---                        [phone], Colonoscopy, flexible; with removal of                         tumor(s), polyp(s), or other lesion(s) by snare                         technique Diagnosis Code(s):     --- Professional ---  K62.1, Rectal polyp                        K63.5, Polyp of colon                        D50.9, Iron deficiency anemia, unspecified CPT copyright 2019 American Medical Association. All rights reserved. The codes documented in this report are preliminary and upon coder review may  be revised to meet current compliance requirements. Jonathon Bellows, MD Jonathon Bellows MD, MD 11/01/2021 8:37:49 AM This report has been signed electronically. Number of Addenda: 0 Note Initiated On: 11/01/2021 7:51 AM Scope Withdrawal Time: 0 hours 8 minutes 17 seconds  Total Procedure Duration: 0 hours 32 minutes 58  seconds  Estimated Blood Loss:  Estimated blood loss: none.      [facility]

## 2021-11-01 NOTE — Anesthesia Procedure Notes (Signed)
Date/Time: 11/01/2021 7:49 AM Performed by: Johnna Acosta, CRNA Pre-anesthesia Checklist: Patient identified, Emergency Drugs available, Suction available, Patient being monitored and Timeout performed Patient Re-evaluated:Patient Re-evaluated prior to induction Oxygen Delivery Method: Circle system utilized Preoxygenation: Pre-oxygenation with 100% oxygen Induction Type: IV induction

## 2021-11-01 NOTE — Anesthesia Postprocedure Evaluation (Signed)
Anesthesia Post Note  Patient: Shelia Chapman  Procedure(s) Performed: COLONOSCOPY WITH PROPOFOL ESOPHAGOGASTRODUODENOSCOPY (EGD)  Patient location during evaluation: Endoscopy Anesthesia Type: General Level of consciousness: awake and alert Pain management: pain level controlled Vital Signs Assessment: post-procedure vital signs reviewed and stable Respiratory status: spontaneous breathing, nonlabored ventilation, respiratory function stable and patient connected to nasal cannula oxygen Cardiovascular status: blood pressure returned to baseline and stable Postop Assessment: no apparent nausea or vomiting Anesthetic complications: no   No notable events documented.   Last Vitals:  Vitals:   11/01/21 0904 11/01/21 0914  BP: 131/78 120/67  Pulse: 83 92  Resp: (!) 22 (!) 26  Temp:    SpO2: 100% 100%    Last Pain:  Vitals:   11/01/21 0914  TempSrc:   PainSc: 0-No pain                 Arita Miss

## 2021-11-01 NOTE — Transfer of Care (Signed)
Immediate Anesthesia Transfer of Care Note  Patient: Shelia Chapman  Procedure(s) Performed: COLONOSCOPY WITH PROPOFOL ESOPHAGOGASTRODUODENOSCOPY (EGD)  Patient Location: PACU  Anesthesia Type:General  Level of Consciousness: sedated  Airway & Oxygen Therapy: Patient Spontanous Breathing  Post-op Assessment: Report given to RN and Post -op Vital signs reviewed and stable  Post vital signs: Reviewed and stable  Last Vitals:  Vitals Value Taken Time  BP 120/65 11/01/21 0844  Temp    Pulse 88 11/01/21 0845  Resp 19 11/01/21 0845  SpO2 99 % 11/01/21 0845  Vitals shown include unvalidated device data.  Last Pain:  Vitals:   11/01/21 0713  TempSrc: Temporal  PainSc: 0-No pain         Complications: No notable events documented.

## 2021-11-01 NOTE — Anesthesia Preprocedure Evaluation (Signed)
Anesthesia Evaluation  Patient identified by MRN, date of birth, ID band Patient awake    Reviewed: Allergy & Precautions, NPO status , Patient's Chart, lab work & pertinent test results  History of Anesthesia Complications Negative for: history of anesthetic complications  Airway Mallampati: II  TM Distance: >3 FB Neck ROM: Full    Dental no notable dental hx. (+) Teeth Intact   Pulmonary sleep apnea and Continuous Positive Airway Pressure Ventilation , neg COPD, Patient abstained from smoking.Not current smoker,    Pulmonary exam normal breath sounds clear to auscultation       Cardiovascular Exercise Tolerance: Good METS(-) hypertension(-) CAD and (-) Past MI negative cardio ROS  (-) dysrhythmias  Rhythm:Regular Rate:Normal - Systolic murmurs    Neuro/Psych  Headaches, PSYCHIATRIC DISORDERS Depression    GI/Hepatic neg GERD  ,(+)     (-) substance abuse  ,   Endo/Other  neg diabetesMorbid obesity  Renal/GU negative Renal ROS     Musculoskeletal   Abdominal (+) + obese,   Peds  Hematology  (+) anemia ,   Anesthesia Other Findings Past Medical History: 02/28/2016: Anemia of unknown etiology No date: Chicken pox No date: Depression No date: Frequent headaches No date: Glaucoma No date: Multiple sclerosis ([place]) No date: Sleep apnea No date: Urine incontinence  Reproductive/Obstetrics                             Anesthesia Physical Anesthesia Plan  ASA: 3  Anesthesia Plan: General   Post-op Pain Management: Minimal or no pain anticipated   Induction: Intravenous  PONV Risk Score and Plan: 3 and Propofol infusion, TIVA and Ondansetron  Airway Management Planned: Nasal Cannula  Additional Equipment: None  Intra-op Plan:   Post-operative Plan:   Informed Consent: I have reviewed the patients History and Physical, chart, labs and discussed the procedure including the  risks, benefits and alternatives for the proposed anesthesia with the patient or authorized representative who has indicated his/her understanding and acceptance.     Dental advisory given  Plan Discussed with: CRNA and Surgeon  Anesthesia Plan Comments: (Discussed risks of anesthesia with patient, including possibility of difficulty with spontaneous ventilation under anesthesia necessitating airway intervention, PONV, and rare risks such as cardiac or respiratory or neurological events, and allergic reactions. Discussed the role of CRNA in patient's perioperative care. Patient understands. Patient informed about increased incidence of above perioperative risk due to high BMI. Patient understands. )        Anesthesia Quick Evaluation

## 2021-11-02 ENCOUNTER — Encounter: Payer: Self-pay | Admitting: Gastroenterology

## 2021-11-02 LAB — SURGICAL PATHOLOGY

## 2021-11-06 ENCOUNTER — Encounter: Payer: Self-pay | Admitting: *Deleted

## 2021-12-02 ENCOUNTER — Encounter: Payer: Self-pay | Admitting: Gastroenterology

## 2021-12-03 NOTE — Progress Notes (Signed)
Done

## 2021-12-05 ENCOUNTER — Ambulatory Visit: Payer: 59 | Admitting: Adult Health

## 2021-12-13 ENCOUNTER — Ambulatory Visit: Payer: 59 | Admitting: Adult Health

## 2021-12-14 ENCOUNTER — Telehealth: Payer: Self-pay | Admitting: Adult Health

## 2021-12-14 NOTE — Telephone Encounter (Signed)
Patient is a [name] patient and would like to know if Dr Olivia Mackie would take her on as a patient.  ?

## 2021-12-17 NOTE — Telephone Encounter (Signed)
Okay for TOC 

## 2021-12-19 ENCOUNTER — Encounter: Payer: Self-pay | Admitting: Internal Medicine

## 2021-12-19 ENCOUNTER — Ambulatory Visit: Payer: 59 | Admitting: Adult Health

## 2021-12-19 ENCOUNTER — Other Ambulatory Visit: Payer: Self-pay

## 2021-12-19 ENCOUNTER — Ambulatory Visit (INDEPENDENT_AMBULATORY_CARE_PROVIDER_SITE_OTHER): Payer: 59 | Admitting: Internal Medicine

## 2021-12-19 VITALS — BP 124/80 | HR 81 | Temp 97.9°F | Ht 63.0 in | Wt 303.8 lb

## 2021-12-19 DIAGNOSIS — E559 Vitamin D deficiency, unspecified: Secondary | ICD-10-CM

## 2021-12-19 DIAGNOSIS — E611 Iron deficiency: Secondary | ICD-10-CM | POA: Diagnosis not present

## 2021-12-19 DIAGNOSIS — R1084 Generalized abdominal pain: Secondary | ICD-10-CM | POA: Diagnosis not present

## 2021-12-19 DIAGNOSIS — Z6841 Body Mass Index (BMI) 40.0 and over, adult: Secondary | ICD-10-CM

## 2021-12-19 DIAGNOSIS — Z1231 Encounter for screening mammogram for malignant neoplasm of breast: Secondary | ICD-10-CM

## 2021-12-19 DIAGNOSIS — Z1329 Encounter for screening for other suspected endocrine disorder: Secondary | ICD-10-CM

## 2021-12-19 DIAGNOSIS — E785 Hyperlipidemia, unspecified: Secondary | ICD-10-CM | POA: Diagnosis not present

## 2021-12-19 DIAGNOSIS — E538 Deficiency of other specified B group vitamins: Secondary | ICD-10-CM

## 2021-12-19 DIAGNOSIS — Z23 Encounter for immunization: Secondary | ICD-10-CM

## 2021-12-19 MED ORDER — CHOLECALCIFEROL 1.25 MG (50000 UT) PO CAPS: 50000.0000 [IU] | ORAL_CAPSULE | ORAL | 1 refills | Status: DC

## 2021-12-19 NOTE — Patient Instructions (Signed)
Tdap (Tetanus, Diphtheria, Pertussis) Vaccine: What You Need to Know 1. Why get vaccinated? Tdap vaccine can prevent tetanus, diphtheria, and pertussis. Diphtheria and pertussis spread from person to person. Tetanus enters the body through cuts or wounds. TETANUS (T) causes painful stiffening of the muscles. Tetanus can lead to serious health problems, including being unable to open the mouth, having trouble swallowing and breathing, or death. DIPHTHERIA (D) can lead to difficulty breathing, heart failure, paralysis, or death. PERTUSSIS (aP), also known as "whooping cough," can cause uncontrollable, violent coughing that makes it hard to breathe, eat, or drink. Pertussis can be extremely serious especially in babies and young children, causing pneumonia, convulsions, brain damage, or death. In teens and adults, it can cause weight loss, loss of bladder control, passing out, and rib fractures from severe coughing. 2. Tdap vaccine Tdap is only for children 7 years and older, adolescents, and adults.  Adolescents should receive a single dose of Tdap, preferably at age 11 or 12 years. Pregnant people should get a dose of Tdap during every pregnancy, preferably during the early part of the third trimester, to help protect the newborn from pertussis. Infants are most at risk for severe, life-threatening complications frompertussis. Adults who have never received Tdap should get a dose of Tdap. Also, adults should receive a booster dose of either Tdap or Td (a different vaccine that protects against tetanus and diphtheria but not pertussis) every 10 years, or after 5 years in the case of a severe or dirty wound or burn. Tdap may be given at the same time as other vaccines. 3. Talk with your health care provider Tell your vaccine provider if the person getting the vaccine: Has had an allergic reaction after a previous dose of any vaccine that protects against tetanus, diphtheria, or pertussis, or has any  severe, life-threatening allergies Has had a coma, decreased level of consciousness, or prolonged seizures within 7 days after a previous dose of any pertussis vaccine (DTP, DTaP, or Tdap) Has seizures or another nervous system problem Has ever had Guillain-Barr Syndrome (also called "GBS") Has had severe pain or swelling after a previous dose of any vaccine that protects against tetanus or diphtheria In some cases, your health care provider may decide to postpone Tdapvaccination until a future visit. People with minor illnesses, such as a cold, may be vaccinated. People who are moderately or severely ill should usually wait until they recover beforegetting Tdap vaccine.  Your health care provider can give you more information. 4. Risks of a vaccine reaction Pain, redness, or swelling where the shot was given, mild fever, headache, feeling tired, and nausea, vomiting, diarrhea, or stomachache sometimes happen after Tdap vaccination. People sometimes faint after medical procedures, including vaccination. Tellyour provider if you feel dizzy or have vision changes or ringing in the ears.  As with any medicine, there is a very remote chance of a vaccine causing asevere allergic reaction, other serious injury, or death. 5. What if there is a serious problem? An allergic reaction could occur after the vaccinated person leaves the clinic. If you see signs of a severe allergic reaction (hives, swelling of the face and throat, difficulty breathing, a fast heartbeat, dizziness, or weakness), call 9-1-1and get the person to the nearest hospital. For other signs that concern you, call your health care provider.  Adverse reactions should be reported to the Vaccine Adverse Event Reporting System (VAERS). Your health care provider will usually file this report, or you can do it yourself. Visit the   VAERS website at www.vaers.hhs.gov or call 1-800-822-7967. VAERS is only for reporting reactions, and VAERS staff  members do not give medical advice. 6. The National Vaccine Injury Compensation Program The National Vaccine Injury Compensation Program (VICP) is a federal program that was created to compensate people who may have been injured by certain vaccines. Claims regarding alleged injury or death due to vaccination have a time limit for filing, which may be as short as two years. Visit the VICP website at www.hrsa.gov/vaccinecompensation or call 1-800-338-2382to learn about the program and about filing a claim. 7. How can I learn more? Ask your health care provider. Call your local or state health department. Visit the website of the Food and Drug Administration (FDA) for vaccine package inserts and additional information at www.fda.gov/vaccines-blood-biologics/vaccines. Contact the Centers for Disease Control and Prevention (CDC): Call 1-800-232-4636 (1-800-CDC-INFO) or Visit CDC's website at www.cdc.gov/vaccines. Vaccine Information Statement Tdap (Tetanus, Diphtheria, Pertussis) Vaccine(05/19/2020) This information is not intended to replace advice given to you by your health care provider. Make sure you discuss any questions you have with your healthcare provider. Document Revised: 06/14/2020 Document Reviewed: 06/14/2020 Elsevier Patient Education  2022 [employer]  

## 2021-12-19 NOTE — Progress Notes (Signed)
Chief Complaint  Patient presents with   Transitions Of Care   Transfer of care  1. Mid ab pain and abdomen hard gets 2-3 months ab pain 8/10 strong pull sensation cant sit up for moment  Noticed x 6 months nothing tried   2. Vit d def  11/27/21  vitamin D level was 27  3. MS stable follows with Dr. Melrose Nakayama on gabapentin 200 mg bid x MWF    Review of Systems  Constitutional:  Negative for weight loss.  HENT:  Negative for hearing loss.   Eyes:  Negative for blurred vision.  Respiratory:  Negative for shortness of breath.   Cardiovascular:  Negative for chest pain.  Gastrointestinal:  Positive for abdominal pain. Negative for blood in stool.  Genitourinary:  Negative for dysuria.  Musculoskeletal:  Negative for falls and joint pain.  Skin:  Negative for rash.  Neurological:  Negative for headaches.  Psychiatric/Behavioral:  Negative for depression.   Past Medical History:  Diagnosis Date   Anemia of unknown etiology 02/28/2016   Chicken pox    Depression    Frequent headaches    Glaucoma    Multiple sclerosis ([place])    Sleep apnea    Urine incontinence    Past Surgical History:  Procedure Laterality Date   COLONOSCOPY WITH PROPOFOL N/A 11/01/2021   Procedure: COLONOSCOPY WITH PROPOFOL;  Surgeon: Jonathon Bellows, MD;  Location: [facility] ENDOSCOPY;  Service: Gastroenterology;  Laterality: N/A;   ESOPHAGOGASTRODUODENOSCOPY N/A 11/01/2021   Procedure: ESOPHAGOGASTRODUODENOSCOPY (EGD);  Surgeon: Jonathon Bellows, MD;  Location: [facility] ENDOSCOPY;  Service: Gastroenterology;  Laterality: N/A;   UTERINE FIBROID SURGERY     Family History  Problem Relation Age of Onset   Depression Mother    Diabetes Mother    Hyperlipidemia Mother    Hypertension Mother    Uterine cancer Mother    Diabetes Sister    Hypertension Sister    Breast cancer Neg Hx    Social History   Socioeconomic History   Marital status: Single    Spouse name: Not on file   Number of children: Not on file   Years of  education: Not on file   Highest education level: Not on file  Occupational History   Not on file  Tobacco Use   Smoking status: Never   Smokeless tobacco: Never  Vaping Use   Vaping Use: Never used  Substance and Sexual Activity   Alcohol use: Never   Drug use: Never   Sexual activity: Not Currently  Other Topics Concern   Not on file  Social History Narrative   Not on file   Social Determinants of Health   Financial Resource Strain: Not on file  Food Insecurity: Not on file  Transportation Needs: Not on file  Physical Activity: Not on file  Stress: Not on file  Social Connections: Not on file  Intimate Partner Violence: Not on file   Current Meds  Medication Sig   baclofen (LIORESAL) 10 MG tablet Take 10 mg by mouth 3 (three) times daily as needed.   Cholecalciferol 1.25 MG (50000 UT) capsule Take 1 capsule (50,000 Units total) by mouth once a week. D3 x 6 months   CRYSELLE-28 0.3-30 MG-MCG tablet Take 1 tablet by mouth daily.   FEROSUL 325 (65 Fe) MG tablet Take 325 mg by mouth daily.   gabapentin (NEURONTIN) 100 MG capsule 200 mg 2 (two) times daily. But taking M, W, F   GILENYA 0.5 MG CAPS Take 1 capsule  by mouth daily.   LUMIGAN 0.01 % SOLN 1 drop at bedtime.   vitamin B-12 (CYANOCOBALAMIN) 1000 MCG tablet Take 1 tablet (1,000 mcg total) by mouth daily.   [DISCONTINUED] Cholecalciferol (VITAMIN D3) 50 MCG (2000 UT) TABS Take by mouth daily.   No Known Allergies Recent Results (from the past 2160 hour(s))  CBC with Differential/Platelet     Status: Abnormal   Collection Time: 09/26/21  9:52 AM  Result Value Ref Range   WBC 4.9 4.0 - 10.5 K/uL   RBC 4.35 3.87 - 5.11 Mil/uL   Hemoglobin 12.5 12.0 - 15.0 g/dL   HCT 38.5 36.0 - 46.0 %   MCV 88.5 78.0 - 100.0 fl   MCHC 32.5 30.0 - 36.0 g/dL   RDW 14.1 11.5 - 15.5 %   Platelets 331.0 150.0 - 400.0 K/uL   Neutrophils Relative % 75.9 43.0 - 77.0 %   Lymphocytes Relative 12.8 12.0 - 46.0 %   Monocytes Relative 7.5  3.0 - 12.0 %   Eosinophils Relative 3.4 0.0 - 5.0 %   Basophils Relative 0.4 0.0 - 3.0 %   Neutro Abs 3.8 1.4 - 7.7 K/uL   Lymphs Abs 0.6 (L) 0.7 - 4.0 K/uL   Monocytes Absolute 0.4 0.1 - 1.0 K/uL   Eosinophils Absolute 0.2 0.0 - 0.7 K/uL   Basophils Absolute 0.0 0.0 - 0.1 K/uL  Comprehensive metabolic panel     Status: Abnormal   Collection Time: 09/26/21  9:52 AM  Result Value Ref Range   Sodium 137 135 - 145 mEq/L   Potassium 4.7 3.5 - 5.1 mEq/L   Chloride 104 96 - 112 mEq/L   CO2 28 19 - 32 mEq/L   Glucose, Bld 110 (H) 70 - 99 mg/dL   BUN 7 6 - 23 mg/dL   Creatinine, Ser 0.98 0.40 - 1.20 mg/dL   Total Bilirubin 0.6 0.2 - 1.2 mg/dL   Alkaline Phosphatase 68 39 - 117 U/L   AST 10 0 - 37 U/L   ALT 8 0 - 35 U/L   Total Protein 6.8 6.0 - 8.3 g/dL   Albumin 3.9 3.5 - 5.2 g/dL   GFR 68.63 >60.00 mL/min    Comment: Calculated using the CKD-EPI Creatinine Equation (2021)   Calcium 9.0 8.4 - 10.5 mg/dL  B12     Status: None   Collection Time: 09/26/21  9:52 AM  Result Value Ref Range   Vitamin B-12 781 211 - 911 pg/mL  IBC + Ferritin     Status: Abnormal   Collection Time: 09/26/21  9:52 AM  Result Value Ref Range   Iron 75 42 - 145 ug/dL   Transferrin 289.0 212.0 - 360.0 mg/dL   Saturation Ratios 18.5 (L) 20.0 - 50.0 %   Ferritin 10.6 10.0 - 291.0 ng/mL   TIBC 404.6 250.0 - 450.0 mcg/dL  HgB A1c     Status: None   Collection Time: 09/28/21  4:01 PM  Result Value Ref Range   Hgb A1c MFr Bld 5.3 4.6 - 6.5 %    Comment: Glycemic Control Guidelines for People with Diabetes:Non Diabetic:  <6%Goal of Therapy: <7%Additional Action Suggested:  >8%   Surgical pathology     Status: None   Collection Time: 11/01/21  7:57 AM  Result Value Ref Range   SURGICAL PATHOLOGY      SURGICAL PATHOLOGY CASE: [id number] PATIENT: [place] [facility] Report     Specimen Submitted: A. Stomach; cbx B. Rectum polyp; cold snare  C. Colon polyp, cecum; cold snare D. Colon  polyp, descending; cold snare  Clinical History: Colon cancer screening Z12.11 IDA E61.1.  Polyps    DIAGNOSIS: A. STOMACH, RANDOM; COLD BIOPSY: - GASTRIC ANTRAL MUCOSA WITH NO SIGNIFICANT HISTOPATHOLOGIC CHANGE. - NEGATIVE FOR H. PYLORI, DYSPLASIA, AND MALIGNANCY.  B. RECTAL POLYP; COLD SNARE: - HYPERPLASTIC POLYP. - NEGATIVE FOR DYSPLASIA AND MALIGNANCY.  C. COLON POLYP, CECUM; COLD SNARE: - TUBULAR ADENOMA. - NEGATIVE FOR HIGH-GRADE DYSPLASIA AND MALIGNANCY.  D. COLON POLYP, DESCENDING; COLD SNARE: - TUBULAR ADENOMA. - NEGATIVE FOR HIGH-GRADE DYSPLASIA AND MALIGNANCY.  GROSS DESCRIPTION: A. Labeled: Gastric cbxs rule out gastritis Received: Formalin Collection time: 7:57 AM on 11/01/2021 Placed into formalin time: 7:57 AM on 11/01/2021 Tissue fragme nt(s): 2 Size: Both 0.3 cm Description: Tan soft tissue fragments Entirely submitted in 1 cassette.  B. Labeled: Rectum polyp cold snare Received: Formalin Collection time: 8:03 AM on 11/01/2021 Placed into formalin time: 8:03 AM on 11/01/2021 Tissue fragment(s): 1 Size: 0.4 x 0.2 x 0.1 cm Description: Tan soft tissue fragment Entirely submitted in 1 cassette.  C. Labeled: Cecum polyp cold snare Received: Formalin Collection time: 8:28 AM on 11/01/2021 Placed into formalin time: 8:28 AM on 11/01/2021 Tissue fragment(s): Multiple Size: Aggregate, 1.0 x 1.0 x 0.2 cm Description: Tan soft tissue fragments Entirely submitted in 1 cassette.  D. Labeled: Descending colon polyp cold snare Received: Formalin Collection time: 8:33 AM on 11/01/2021 Placed into formalin time: 8:33 AM on 11/01/2021 Tissue fragment(s): 2 Size: Ranges from 0.3-0.4 cm Description: Received are tan soft tissue fragments, admixed with intestinal debris.  The ratio of soft tissue to intestinal de bris is 40: 60. Entirely submitted in 1 cassette.  CM 11/01/2021  Final Diagnosis performed by Allena Napoleon, MD.   Electronically signed 11/02/2021  11:53:36AM The electronic signature indicates that the named Attending Pathologist has evaluated the specimen Technical component performed at [facility], [address] [place], [place] [address] Lab: [phone] Dir: Rush Farmer, MD, MMM  Professional component performed at [facility], [facility], [place], [place], [place] [address] Lab: [phone] Dir: Kathi Simpers, MD    Objective  Body mass index is 53.82 kg/m. Wt Readings from Last 3 Encounters:  12/19/21 (!) 303 lb 12.8 oz (137.8 kg)  11/01/21 294 lb (133.4 kg)  10/26/21 298 lb (135.2 kg)   Temp Readings from Last 3 Encounters:  12/19/21 97.9 F (36.6 C) (Oral)  11/01/21 (!) 96.9 F (36.1 C) (Temporal)  09/26/21 (!) 95.9 F (35.5 C)   BP Readings from Last 3 Encounters:  12/19/21 124/80  11/01/21 120/67  10/26/21 (!) 163/86   Pulse Readings from Last 3 Encounters:  12/19/21 81  11/01/21 92  10/26/21 84    Physical Exam Vitals and nursing note reviewed.  Constitutional:      Appearance: Normal appearance. She is well-developed and well-groomed.  HENT:     Head: Normocephalic and atraumatic.  Eyes:     Conjunctiva/sclera: Conjunctivae normal.     Pupils: Pupils are equal, round, and reactive to light.  Cardiovascular:     Rate and Rhythm: Normal rate and regular rhythm.     Heart sounds: Normal heart sounds. No murmur heard. Pulmonary:     Effort: Pulmonary effort is normal.     Breath sounds: Normal breath sounds.  Abdominal:     General: Abdomen is flat. Bowel sounds are normal.     Tenderness: There is no abdominal tenderness.  Musculoskeletal:  General: No tenderness.  Skin:    General: Skin is warm and dry.  Neurological:     General: No focal deficit present.     Mental Status: She is alert and oriented to person, place, and time. Mental status is at baseline.     Cranial Nerves: Cranial nerves 2-12 are intact.     Motor: Motor function is intact.      Coordination: Coordination is intact.     Gait: Gait is intact.  Psychiatric:        Attention and Perception: Attention and perception normal.        Mood and Affect: Mood and affect normal.        Speech: Speech normal.        Behavior: Behavior normal. Behavior is cooperative.        Thought Content: Thought content normal.        Cognition and Memory: Cognition and memory normal.        Judgment: Judgment normal.    Assessment  Plan  Generalized abdominal pain appears diasthesis recti on exam- Plan: CT ABDOMEN PELVIS WO CONTRAST   Hyperlipidemia, unspecified hyperlipidemia type - Plan: Comprehensive metabolic panel, CBC with Differential/Platelet, Lipid panel  Iron deficiency - Plan: Comprehensive metabolic panel, CBC with Differential/Platelet, IBC + Ferritin  Vitamin D deficiency - Plan: Cholecalciferol 1.25 MG (50000 UT) capsule  Vit D 11/27/21 27  Morbid obesity with BMI of 50.0-59.9, adult (HCC) Rec healthy diet and exercise   B12 deficiency - 888 11/27/21  On B12 1000 mcg otc  HM  Flu utd  3/3 covid shots  Tdap declines 12/19/21   Mammogram 02/28/21 negative  Colonoscopy 11/01/21 repeat in 7 years Dr. Vicente Males  Pap has all female parts  -pap at next visit   Rec healthy diet and exercise    Provider: Dr. Olivia Mackie McLean-Scocuzza-Internal Medicine

## 2021-12-20 ENCOUNTER — Other Ambulatory Visit: Payer: Self-pay | Admitting: *Deleted

## 2021-12-21 ENCOUNTER — Telehealth: Payer: Self-pay | Admitting: Internal Medicine

## 2021-12-21 MED ORDER — VITAMIN B-12 1000 MCG PO TABS: 1000.0000 ug | ORAL_TABLET | Freq: Every day | ORAL | 1 refills | Status: AC

## 2021-12-21 NOTE — Telephone Encounter (Signed)
Pt called in requesting for refill on medication (vitamin B-12 (CYANOCOBALAMIN) 1000 MCG tablet). Pt requesting for the script to go to [facility] on [address]. Pt requesting callback  ?

## 2021-12-24 NOTE — Telephone Encounter (Signed)
Medication sent in to requested pharmacy by Dr Tasia Catchings on 12/21/21. Patient informed and verbalized understanding.  ? ?

## 2022-01-02 ENCOUNTER — Ambulatory Visit
Admission: RE | Admit: 2022-01-02 | Discharge: 2022-01-02 | Disposition: A | Discharge status: Home / Self Care | Payer: 59 | Source: Ambulatory Visit | Attending: Internal Medicine | Admitting: Internal Medicine

## 2022-01-02 ENCOUNTER — Other Ambulatory Visit: Payer: Self-pay

## 2022-01-02 DIAGNOSIS — R1084 Generalized abdominal pain: Secondary | ICD-10-CM | POA: Diagnosis present

## 2022-01-02 IMAGING — CT CT ABD-PELV W/O CM
2 of 4 series · 16 of 46 positions shown, 18 images · non-contrast
Comparison: None.

CLINICAL DATA: Generalized abdominal pain for the past 6 months.
Severe pelvic pain when standing. The patient had uterine fibroids
resected in [[place]].



[Series 2: axials routine abdomen pelvis without 5.00 · axial · non-contrast · 0.81mm/px · z∈[-1507,-1052]mm · 13 of 101 slices shown, 15 images]
[im 5/101  soft-tissue]
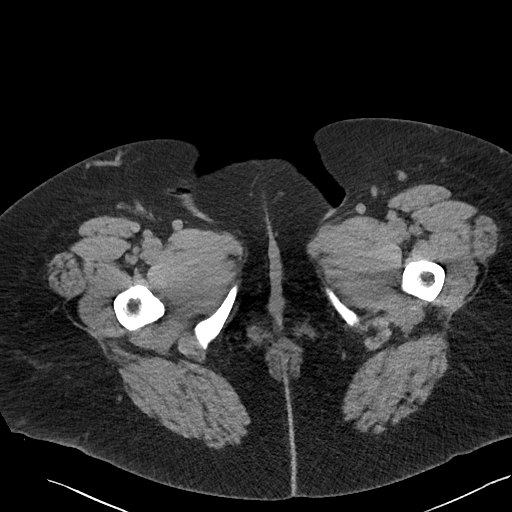
[im 5/101  bone]
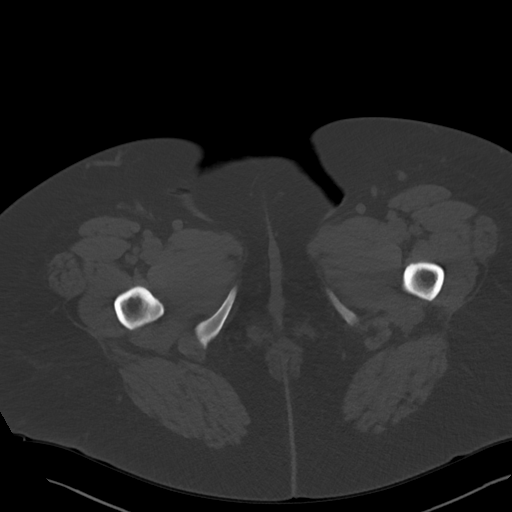
[im 14/101  soft-tissue]
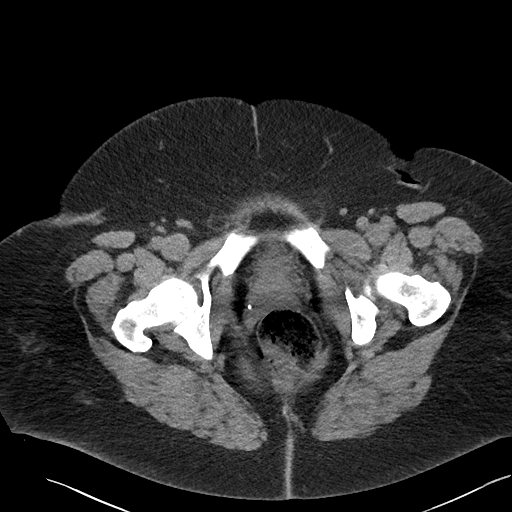
[im 22/101  soft-tissue]
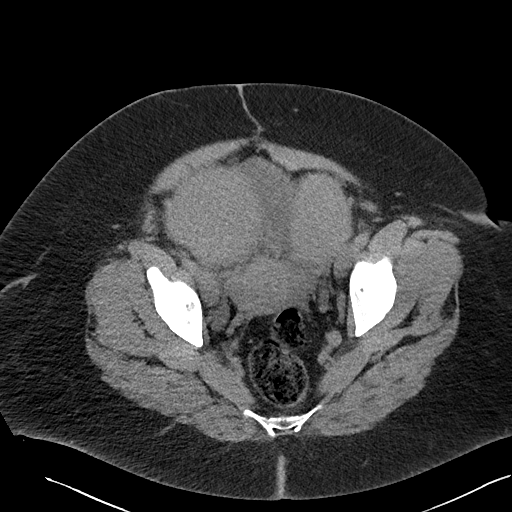
[im 27/101  soft-tissue]
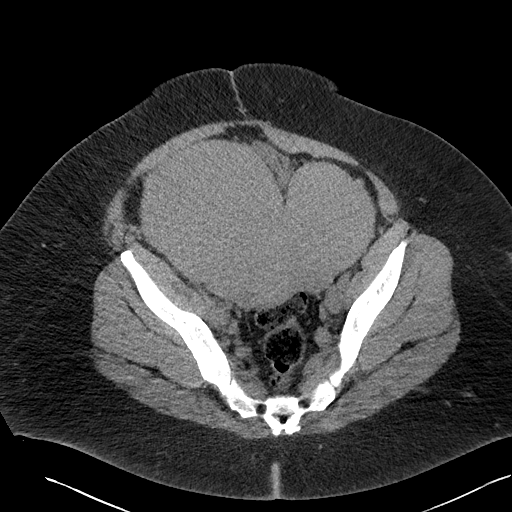
[im 35/101  soft-tissue]
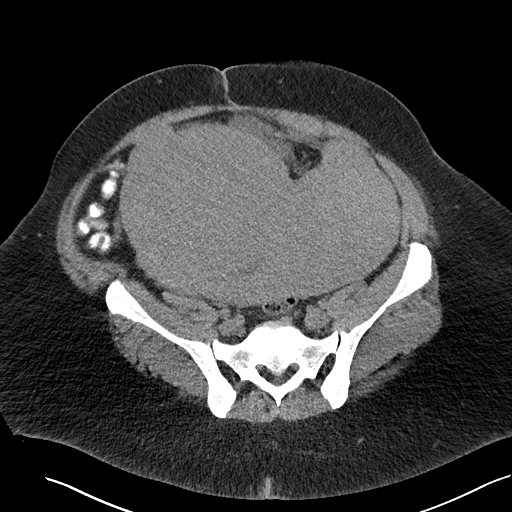
[im 44/101  soft-tissue]
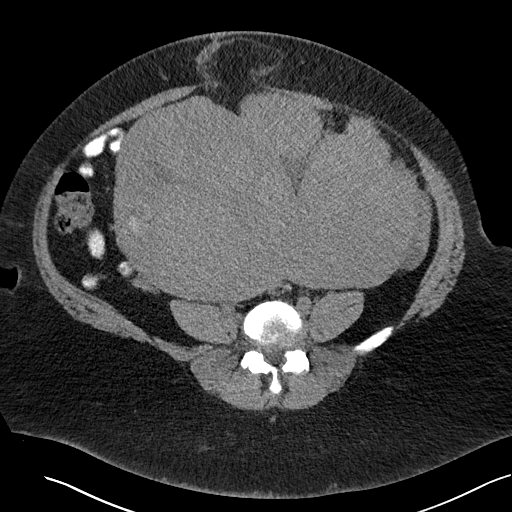
[im 53/101  soft-tissue]
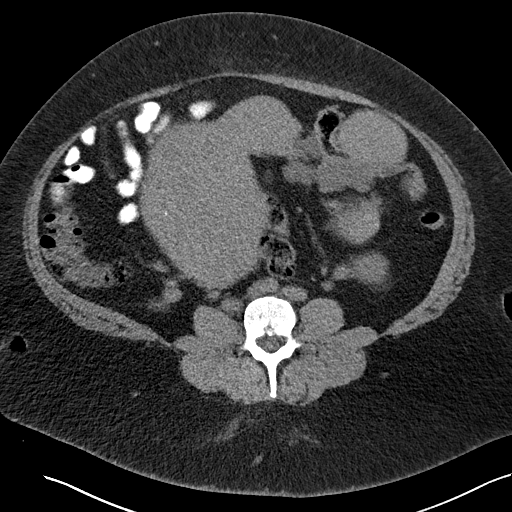
[im 57/101  soft-tissue]
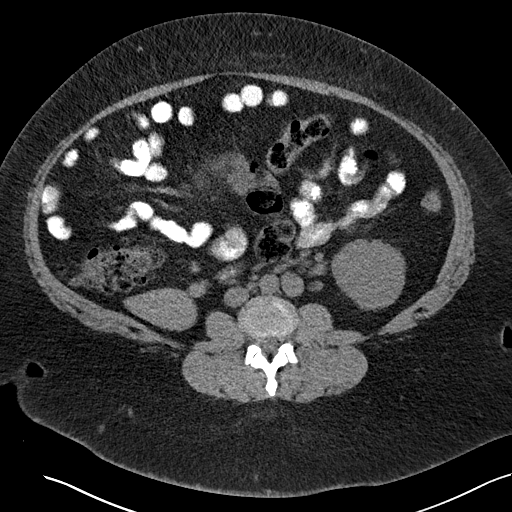
[im 66/101  soft-tissue]
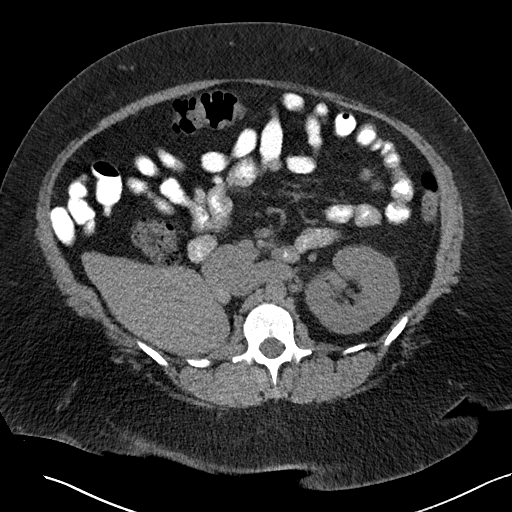
[im 66/101  bone]
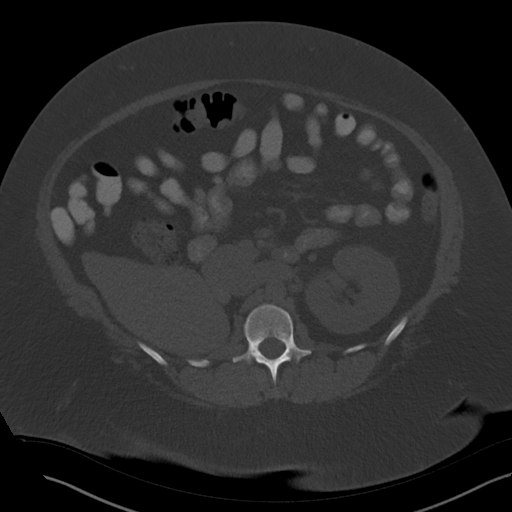
[im 74/101  soft-tissue]
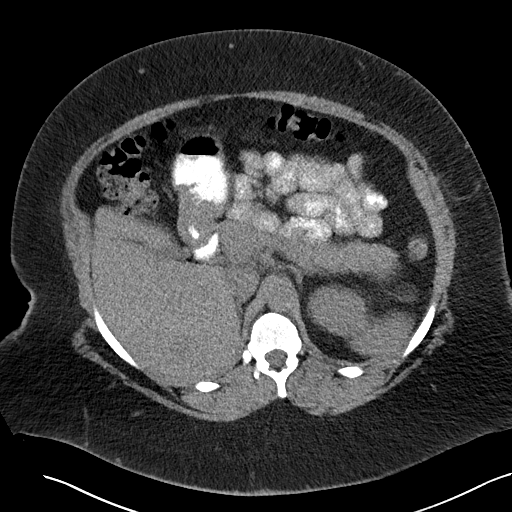
[im 79/101  soft-tissue]
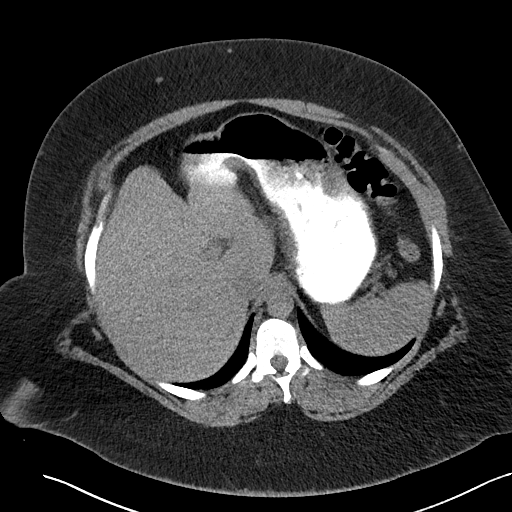
[im 87/101  soft-tissue]
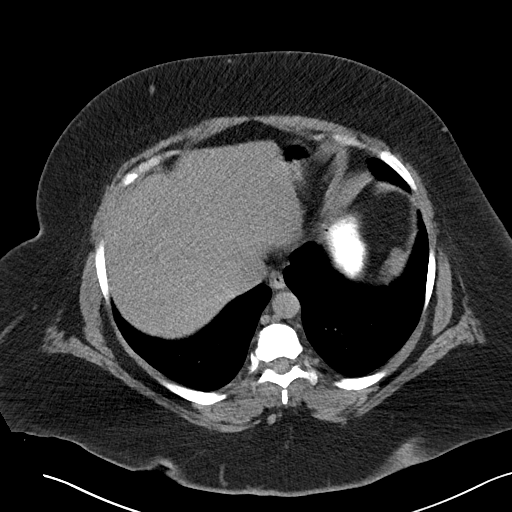
[im 96/101  soft-tissue]
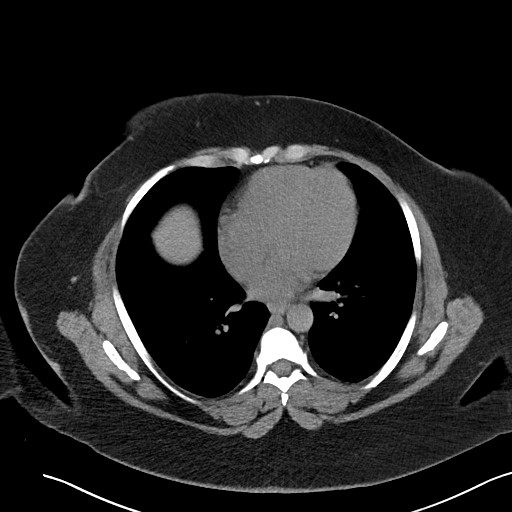

[Series 4: coronals routine abdomen pelvis without 2.00 cor · coronal · non-contrast · 0.81mm/px · 3 of 199 slices shown]
[im 67/199  soft-tissue]
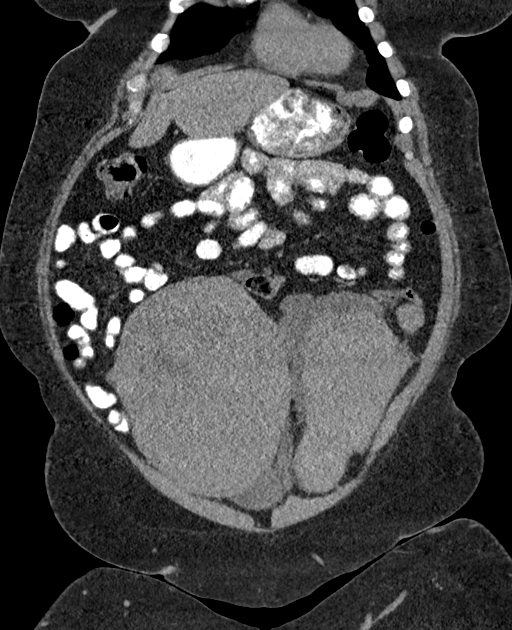
[im 89/199  soft-tissue]
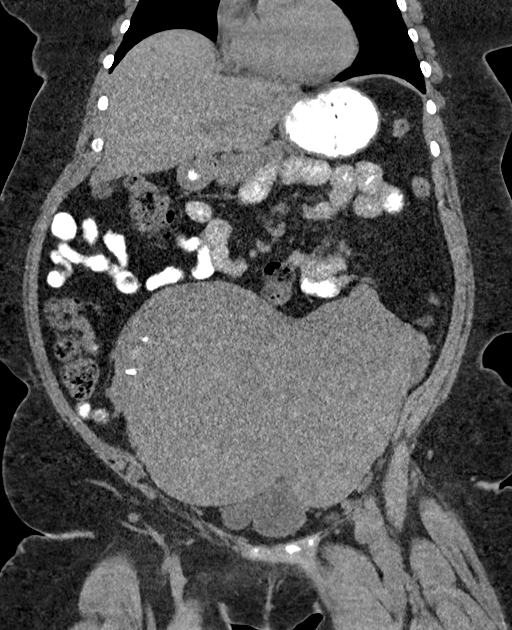
[im 111/199  soft-tissue]
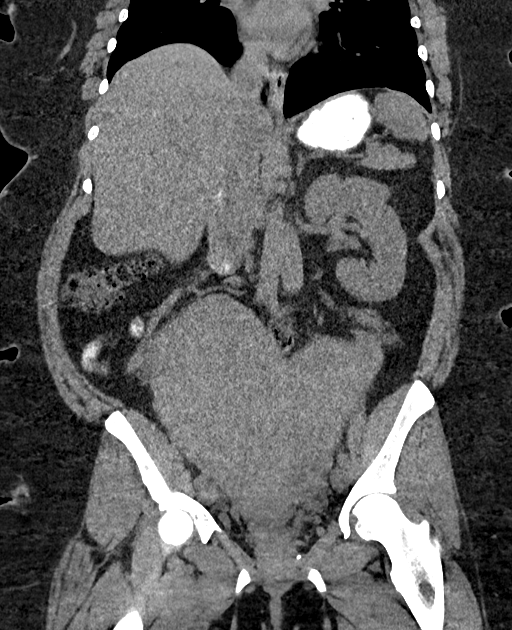

[16 of 46 positions shown; findings below may reference images not displayed]

FINDINGS: Lower chest: Unremarkable.

Hepatobiliary: No focal liver abnormality is seen. No gallstones,
gallbladder wall thickening, or biliary dilatation.

Pancreas: Unremarkable. No pancreatic ductal dilatation or
surrounding inflammatory changes.

Spleen: Normal in size without focal abnormality.

Adrenals/Urinary Tract: Normal appearing adrenal glands, left
kidney, left ureter and urinary bladder. The right kidney is not
visualized.

Stomach/Bowel: Minimal right colon diverticulosis without evidence
of diverticulitis. The appendix is not visualized. Unremarkable
stomach and small bowel.

Vascular/Lymphatic: No significant vascular findings are present. No
enlarged abdominal or pelvic lymph nodes.

Reproductive: Multiple large, oval, mildly heterogeneous uterine
masses. The largest is on the right and contains dystrophic
calcifications. The conglomeration of masses measures 24.2 x 15.7 cm
on image number 61/2 and 20.0 cm in length on sagittal image number
104/6.

The ovaries are laterally displaced and flattened by the enlarged
uterus with no gross ovarian abnormality seen.

Other: Infraumbilical midline surgical scar. Supraumbilical ventral
hernia with the hernia defect measuring 5.1 cm in transverse
diameter with a moderate amount of bilobed herniated fat in the
overlying subcutaneous fat.

Musculoskeletal: Mild dextroconvex thoracolumbar scoliosis. Mild
lower thoracic spine degenerative changes. Minimal lumbar spine
degenerative changes.
IMPRESSION: 1. Markedly enlarged, fibroid uterus measuring 24.2 x 20.0 x
cm.
2. Right renal agenesis.
3. Moderate-sized supraumbilical ventral hernia containing herniated
fat.
4. Minimal right colon diverticulosis.

## 2022-01-04 ENCOUNTER — Ambulatory Visit: Payer: 59 | Admitting: Family

## 2022-01-07 ENCOUNTER — Encounter: Payer: Self-pay | Admitting: Internal Medicine

## 2022-01-07 DIAGNOSIS — K439 Ventral hernia without obstruction or gangrene: Secondary | ICD-10-CM | POA: Insufficient documentation

## 2022-01-07 DIAGNOSIS — M4185 Other forms of scoliosis, thoracolumbar region: Secondary | ICD-10-CM | POA: Insufficient documentation

## 2022-01-07 DIAGNOSIS — M47814 Spondylosis without myelopathy or radiculopathy, thoracic region: Secondary | ICD-10-CM | POA: Insufficient documentation

## 2022-01-07 DIAGNOSIS — K5790 Diverticulosis of intestine, part unspecified, without perforation or abscess without bleeding: Secondary | ICD-10-CM | POA: Insufficient documentation

## 2022-01-07 DIAGNOSIS — M47816 Spondylosis without myelopathy or radiculopathy, lumbar region: Secondary | ICD-10-CM | POA: Insufficient documentation

## 2022-01-07 DIAGNOSIS — N858 Other specified noninflammatory disorders of uterus: Secondary | ICD-10-CM | POA: Insufficient documentation

## 2022-01-09 ENCOUNTER — Ambulatory Visit (INDEPENDENT_AMBULATORY_CARE_PROVIDER_SITE_OTHER): Payer: 59 | Admitting: Gastroenterology

## 2022-01-09 ENCOUNTER — Encounter: Payer: Self-pay | Admitting: Gastroenterology

## 2022-01-09 VITALS — BP 153/90 | HR 85 | Temp 97.6°F | Wt 302.4 lb

## 2022-01-09 DIAGNOSIS — E611 Iron deficiency: Secondary | ICD-10-CM

## 2022-01-09 DIAGNOSIS — R8290 Unspecified abnormal findings in urine: Secondary | ICD-10-CM | POA: Diagnosis not present

## 2022-01-09 NOTE — Progress Notes (Signed)
?  ?Jonathon Bellows MD, MRCP(U.K) ?[address]  ?[address]  ?[place], [place] [address]  ?Main: [phone]  ?Fax: [phone] ? ? ?Primary Care Physician: McLean-Scocuzza, Nino Glow, MD ? ?Primary Gastroenterologist:  Dr. Jonathon Bellows  ? ?Chief Complaint  ?Patient presents with  ? IDA  ? ? ?HPI: Shelia Chapman is a 46 y.o. female ? ? ?Summary of history : ? ?She was initially referred and seen in November 2022 for iron deficiency anemia.  Ferritin of 15 with hemoglobin of 12.8 and iron percentage saturation of 19 she was also B12 deficient.  Denied any overt blood loss except that her.'s would be very heavy she was changing up to 10 pads during the initial stages of her cycle. ? ?Interval history 09/12/2021-01/09/2022 ? ?09/12/2021-celiac serology, H. pylori breath test negative urine showed 1+ RBC.  Plan was to recheck it ?11/01/2021: EGD: Normal appearance: Colonoscopy: 3 sessile polyps in the rectum descending colon and cecum that were resected small in size 2-3 polyps were hyperplastic and biopsies in the stomach was negative for H. pylori.  Repeat colonoscopy in 7 years ? ?01/05/2022 CT abdomen pelvis without contrast showed marked enlarged fibroid uterus moderate size supraumbilical ventral hernia containing herniated fat ? ?She is doing well.  On her menstrual cycle.  Presently still very heavy she is due to see her GYN.  No other complaints. ? ?Current Outpatient Medications  ?Medication Sig Dispense Refill  ? baclofen (LIORESAL) 10 MG tablet Take 10 mg by mouth 3 (three) times daily as needed.    ? Cholecalciferol 1.25 MG (50000 UT) capsule Take 1 capsule (50,000 Units total) by mouth once a week. D3 x 6 months 13 capsule 1  ? CRYSELLE-28 0.3-30 MG-MCG tablet Take 1 tablet by mouth daily. 28 tablet 1  ? FEROSUL 325 (65 Fe) MG tablet Take 325 mg by mouth daily.    ? gabapentin (NEURONTIN) 100 MG capsule 200 mg 2 (two) times daily. But taking M, W, F    ? GILENYA 0.5 MG CAPS Take 1 capsule by mouth daily.    ?  LUMIGAN 0.01 % SOLN 1 drop at bedtime.    ? vitamin B-12 (CYANOCOBALAMIN) 1000 MCG tablet Take 1 tablet (1,000 mcg total) by mouth daily. 90 tablet 1  ? ?No current facility-administered medications for this visit.  ? ? ?Allergies as of 01/09/2022  ? (No Known Allergies)  ? ? ?ROS: ? ?General: Negative for anorexia, weight loss, fever, chills, fatigue, weakness. ?ENT: Negative for hoarseness, difficulty swallowing , nasal congestion. ?CV: Negative for chest pain, angina, palpitations, dyspnea on exertion, peripheral edema.  ?Respiratory: Negative for dyspnea at rest, dyspnea on exertion, cough, sputum, wheezing.  ?GI: See history of present illness. ?GU:  Negative for dysuria, hematuria, urinary incontinence, urinary frequency, nocturnal urination.  ?Endo: Negative for unusual weight change.  ?  ?Physical Examination: ? ? There were no vitals taken for this visit. ? ?General: Well-nourished, well-developed in no acute distress.  ?Eyes: No icterus. Conjunctivae pink. ? ?Abdomen: Bowel sounds are normal, midline lower abdominal central scar reducible hernia in the upper part of her abdomen nontender, hepatosplenomegaly or masses, no abdominal bruits or hernia , no rebound or guarding.   ?Extremities: No lower extremity edema. No clubbing or deformities. ?Neuro: Alert and oriented x 3.  Grossly intact. ?Skin: Warm and dry, no jaundice.   ?Psych: Alert and cooperative, normal mood and affect. ? ? ?Imaging Studies: ?CT ABDOMEN PELVIS WO CONTRAST ? ?Result Date: 01/05/2022 ?CLINICAL DATA:  Generalized abdominal pain for the past 6 months. Severe pelvic pain when standing. The patient had uterine fibroids resected in 2012. EXAM: CT ABDOMEN AND PELVIS WITHOUT CONTRAST TECHNIQUE: Multidetector CT imaging of the abdomen and pelvis was performed following the standard protocol without IV contrast. RADIATION DOSE REDUCTION: This exam was performed according to the departmental dose-optimization program which includes automated  exposure control, adjustment of the mA and/or kV according to patient size and/or use of iterative reconstruction technique. COMPARISON:  None. FINDINGS: Lower chest: Unremarkable. Hepatobiliary: No focal liver abnormality is seen. No gallstones, gallbladder wall thickening, or biliary dilatation. Pancreas: Unremarkable. No pancreatic ductal dilatation or surrounding inflammatory changes. Spleen: Normal in size without focal abnormality. Adrenals/Urinary Tract: Normal appearing adrenal glands, left kidney, left ureter and urinary bladder. The right kidney is not visualized. Stomach/Bowel: Minimal right colon diverticulosis without evidence of diverticulitis. The appendix is not visualized. Unremarkable stomach and small bowel. Vascular/Lymphatic: No significant vascular findings are present. No enlarged abdominal or pelvic lymph nodes. Reproductive: Multiple large, oval, mildly heterogeneous uterine masses. The largest is on the right and contains dystrophic calcifications. The conglomeration of masses measures 24.2 x 15.7 cm on image number 61/2 and 20.0 cm in length on sagittal image number 104/6. The ovaries are laterally displaced and flattened by the enlarged uterus with no gross ovarian abnormality seen. Other: Infraumbilical midline surgical scar. Supraumbilical ventral hernia with the hernia defect measuring 5.1 cm in transverse diameter with a moderate amount of bilobed herniated fat in the overlying subcutaneous fat. Musculoskeletal: Mild dextroconvex thoracolumbar scoliosis. Mild lower thoracic spine degenerative changes. Minimal lumbar spine degenerative changes. IMPRESSION: 1. Markedly enlarged, fibroid uterus measuring 24.2 x 20.0 x 15.7 cm. 2. Right renal agenesis. 3. Moderate-sized supraumbilical ventral hernia containing herniated fat. 4. Minimal right colon diverticulosis. Electronically Signed   By: Claudie Revering M.D.   On: 01/05/2022 13:18   ? ?Assessment and Plan:  ? ?Shelia Chapman is a 46 y.o.  y/o female with a history of iron deficiency anemia with recent likely secondary to menorrhagia.  GI evaluation including EGD and colonoscopy was negative H. pylori breath test was negative celiac serology was negative.  Urinalysis on initial visit showed 1+ blood .  ? ? ?Plan ?1.  She had 1+ blood in her urine on her initial test unfortunately I cannot test it today as she is on her cycle I advised her to have it checked at her primary care provider's office visit. ? ?2.  She has a ventral hernia I recommended she have it get evaluated by a Psychologist, sport and exercise.  She had a CT scan of the abdomen performed yesterday by her primary care provider.   ? ?3.  Menorrhagia she is due to see her GYN ? ? ? ?Dr Jonathon Bellows  MD,MRCP [facility]) ?Follow up in as needed ?

## 2022-02-15 ENCOUNTER — Other Ambulatory Visit (INDEPENDENT_AMBULATORY_CARE_PROVIDER_SITE_OTHER): Payer: 59

## 2022-02-15 ENCOUNTER — Other Ambulatory Visit: Payer: Self-pay | Admitting: Internal Medicine

## 2022-02-15 DIAGNOSIS — R1084 Generalized abdominal pain: Secondary | ICD-10-CM

## 2022-02-15 DIAGNOSIS — Z1329 Encounter for screening for other suspected endocrine disorder: Secondary | ICD-10-CM | POA: Diagnosis not present

## 2022-02-15 DIAGNOSIS — E611 Iron deficiency: Secondary | ICD-10-CM | POA: Diagnosis not present

## 2022-02-15 DIAGNOSIS — E785 Hyperlipidemia, unspecified: Secondary | ICD-10-CM

## 2022-02-15 LAB — CBC WITH DIFFERENTIAL/PLATELET
Basophils Absolute: 0 10*3/uL (ref 0.0–0.1)
Basophils Relative: 0.2 % (ref 0.0–3.0)
Eosinophils Absolute: 0.2 10*3/uL (ref 0.0–0.7)
Eosinophils Relative: 3.2 % (ref 0.0–5.0)
HCT: 39.7 % (ref 36.0–46.0)
Hemoglobin: 13.1 g/dL (ref 12.0–15.0)
Lymphocytes Relative: 17.3 % (ref 12.0–46.0)
Lymphs Abs: 1.1 10*3/uL (ref 0.7–4.0)
MCHC: 33.1 g/dL (ref 30.0–36.0)
MCV: 89.1 fl (ref 78.0–100.0)
Monocytes Absolute: 0.5 10*3/uL (ref 0.1–1.0)
Monocytes Relative: 7.3 % (ref 3.0–12.0)
Neutro Abs: 4.5 10*3/uL (ref 1.4–7.7)
Neutrophils Relative %: 72 % (ref 43.0–77.0)
Platelets: 312 10*3/uL (ref 150.0–400.0)
RBC: 4.45 Mil/uL (ref 3.87–5.11)
RDW: 14.2 % (ref 11.5–15.5)
WBC: 6.2 10*3/uL (ref 4.0–10.5)

## 2022-02-15 LAB — TSH: TSH: 2.56 u[IU]/mL (ref 0.35–5.50)

## 2022-02-15 LAB — IBC + FERRITIN
Ferritin: 15.7 ng/mL (ref 10.0–291.0)
Iron: 54 ug/dL (ref 42–145)
Saturation Ratios: 14.3 % — ABNORMAL LOW (ref 20.0–50.0)
TIBC: 378 ug/dL (ref 250.0–450.0)
Transferrin: 270 mg/dL (ref 212.0–360.0)

## 2022-02-15 LAB — COMPREHENSIVE METABOLIC PANEL
ALT: 13 U/L (ref 0–35)
AST: 12 U/L (ref 0–37)
Albumin: 4.1 g/dL (ref 3.5–5.2)
Alkaline Phosphatase: 66 U/L (ref 39–117)
BUN: 15 mg/dL (ref 6–23)
CO2: 27 mEq/L (ref 19–32)
Calcium: 9 mg/dL (ref 8.4–10.5)
Chloride: 102 mEq/L (ref 96–112)
Creatinine, Ser: 0.98 mg/dL (ref 0.40–1.20)
GFR: 68.44 mL/min (ref >60.00)
Glucose, Bld: 132 mg/dL — ABNORMAL HIGH (ref 70–99)
Potassium: 4.5 mEq/L (ref 3.5–5.1)
Sodium: 138 mEq/L (ref 135–145)
Total Bilirubin: 0.6 mg/dL (ref 0.2–1.2)
Total Protein: 6.7 g/dL (ref 6.0–8.3)

## 2022-02-15 LAB — LIPID PANEL
Cholesterol: 231 mg/dL — ABNORMAL HIGH (ref 0–200)
HDL: 30.7 mg/dL — ABNORMAL LOW (ref >39.00)
LDL Cholesterol: 175 mg/dL — ABNORMAL HIGH (ref 0–99)
NonHDL: 200.67
Total CHOL/HDL Ratio: 8
Triglycerides: 128 mg/dL (ref 0.0–149.0)
VLDL: 25.6 mg/dL (ref 0.0–40.0)

## 2022-02-15 MED ORDER — PRAVASTATIN SODIUM 20 MG PO TABS: 20.0000 mg | ORAL_TABLET | Freq: Every day | ORAL | 3 refills | Status: DC

## 2022-02-26 ENCOUNTER — Ambulatory Visit (INDEPENDENT_AMBULATORY_CARE_PROVIDER_SITE_OTHER): Payer: 59

## 2022-02-26 VITALS — Ht 63.0 in | Wt 302.0 lb

## 2022-02-26 DIAGNOSIS — Z0000 Encounter for general adult medical examination without abnormal findings: Secondary | ICD-10-CM | POA: Diagnosis not present

## 2022-02-26 NOTE — Progress Notes (Signed)
Subjective:   Nohelia Galliano is a 46 y.o. female who presents for an Initial Medicare Annual Wellness Visit.  Review of Systems    No ROS.  Medicare Wellness Virtual Visit.  Visual/audio telehealth visit, UTA vital signs.   See social history for additional risk factors.         Objective:    Today's Vitals   02/26/22 0952  Weight: (!) 302 lb (137 kg)  Height: 5\' 3"  (1.6 m)   Body mass index is 53.5 kg/m.     02/26/2022    9:55 AM 11/01/2021    7:11 AM 07/23/2021    1:58 PM 03/02/2021   11:22 AM  Advanced Directives  Does Patient Have a Medical Advance Directive? No No No No  Does patient want to make changes to medical advance directive?    No - Patient declined  Would patient like information on creating a medical advance directive? No - Patient declined No - Patient declined      Current Medications (verified) Outpatient Encounter Medications as of 02/26/2022  Medication Sig   baclofen (LIORESAL) 10 MG tablet Take 10 mg by mouth 3 (three) times daily as needed.   Cholecalciferol 1.25 MG (50000 UT) capsule Take 1 capsule (50,000 Units total) by mouth once a week. D3 x 6 months   CRYSELLE-28 0.3-30 MG-MCG tablet Take 1 tablet by mouth daily.   FEROSUL 325 (65 Fe) MG tablet Take 325 mg by mouth daily.   gabapentin (NEURONTIN) 100 MG capsule 200 mg 2 (two) times daily. But taking M, W, F   GILENYA 0.5 MG CAPS Take 1 capsule by mouth daily.   LUMIGAN 0.01 % SOLN 1 drop at bedtime.   pravastatin (PRAVACHOL) 20 MG tablet Take 1 tablet (20 mg total) by mouth daily. After 6 pm   vitamin B-12 (CYANOCOBALAMIN) 1000 MCG tablet Take 1 tablet (1,000 mcg total) by mouth daily.   No facility-administered encounter medications on file as of 02/26/2022.    Allergies (verified) Patient has no known allergies.   History: Past Medical History:  Diagnosis Date   Anemia of unknown etiology 02/28/2016   Chicken pox    Depression    Frequent headaches    Glaucoma    Multiple  sclerosis (HCC)    Sleep apnea    Urine incontinence    Past Surgical History:  Procedure Laterality Date   COLONOSCOPY WITH PROPOFOL N/A 11/01/2021   Procedure: COLONOSCOPY WITH PROPOFOL;  Surgeon: Wyline Mood, MD;  Location[facility] ENDOSCOPY;  Service: Gastroenterology;  Laterality: N/A;   ESOPHAGOGASTRODUODENOSCOPY N/A 11/01/2021   Procedure: ESOPHAGOGASTRODUODENOSCOPY (EGD);  Surgeon: Wyline Mood, MD;  Location: [facility] ENDOSCOPY;  Service: Gastroenterology;  Laterality: N/A;   UTERINE FIBROID SURGERY     Family History  Problem Relation Age of Onset   Depression Mother    Diabetes Mother    Hyperlipidemia Mother    Hypertension Mother    Uterine cancer Mother    Diabetes Sister    Hypertension Sister    Breast cancer Neg Hx    Social History   Socioeconomic History   Marital status: Single    Spouse name: Not on file   Number of children: Not on file   Years of education: Not on file   Highest education level: Not on file  Occupational History   Not on file  Tobacco Use   Smoking status: Never   Smokeless tobacco: Never  Vaping Use   Vaping Use: Never used  Substance and  Sexual Activity   Alcohol use: Never   Drug use: Never   Sexual activity: Not Currently  Other Topics Concern   Not on file  Social History Narrative   Not on file   Social Determinants of Health   Financial Resource Strain: Low Risk    Difficulty of Paying Living Expenses: Not hard at all  Food Insecurity: No Food Insecurity   Worried About Running Out of Food in the Last Year: Never true   Ran Out of Food in the Last Year: Never true  Transportation Needs: No Transportation Needs   Lack of Transportation (Medical): No   Lack of Transportation (Non-Medical): No  Physical Activity: Unknown   Days of Exercise per Week: 4 days   Minutes of Exercise per Session: Not on file  Stress: No Stress Concern Present   Feeling of Stress : Not at all  Social Connections: Unknown   Frequency of  Communication with Friends and Family: Not on file   Frequency of Social Gatherings with Friends and Family: More than three times a week   Attends Religious Services: Not on Scientist, clinical (histocompatibility and immunogenetics) or Organizations: Not on file   Attends Banker Meetings: Not on file   Marital Status: Not on file    Tobacco Counseling Counseling given: Not Answered   Clinical Intake:  Pre-visit preparation completed: Yes           How often do you need to have someone help you when you read instructions, pamphlets, or other written materials from your doctor or pharmacy?: 1 - Never  Interpreter Needed?: No      Activities of Daily Living    02/26/2022    9:56 AM  In your present state of health, do you have any difficulty performing the following activities:  Hearing? 0  Vision? 0  Difficulty concentrating or making decisions? 0  Walking or climbing stairs? 0  Dressing or bathing? 0  Doing errands, shopping? 1  Preparing Food and eating ? Y  Comment Family assist as needed  Using the Toilet? N  In the past six months, have you accidently leaked urine? N  Do you have problems with loss of bowel control? N  Managing your Medications? N  Managing your Finances? Y  Comment Family assist as needed  Housekeeping or managing your Housekeeping? Y  Comment Family assist as needed    Patient Care Team: McLean-Scocuzza, Pasty Spillers, MD as PCP - General (Internal Medicine)  Indicate any recent Medical Services you may have received from other than [facility] providers in the past year (date may be approximate).     Assessment:   This is a routine wellness examination for Zendaya.  Virtual Visit via Telephone Note  I connected with  [name] on 02/26/22 at  9:45 AM EDT by telephone and verified that I am speaking with the correct person using two identifiers.  Persons participating in the virtual visit: patient/Nurse Health Advisor   I discussed the limitations of  performing an evaluation and management service by telehealth. The patient expressed understanding and agreed to proceed. We continued and completed visit with audio only. Some vital signs may be absent or patient reported.   Hearing/Vision screen Hearing Screening - Comments:: Patient is able to hear conversational tones without difficulty.  No issues reported. Vision Screening - Comments:: Followed by [facility] Wears corrective lenses They have seen their ophthalmologist in the last 12 months.    Dietary issues  and exercise activities discussed: Current Exercise Habits: Home exercise routine, Type of exercise: treadmill, Intensity: Mild Regular diet Good water intake   Goals Addressed             This Visit's Progress    Maintain healthy lifestyle       Stay active Healthy diet       Depression Screen    02/26/2022    9:56 AM 10/26/2021   10:12 AM 09/26/2021    9:13 AM 09/03/2021    9:10 AM 02/15/2021    9:10 AM  PHQ 2/9 Scores  PHQ - 2 Score 0 0 0 0 0  PHQ- 9 Score  3   0    Fall Risk    02/26/2022    9:56 AM 09/26/2021    9:13 AM 09/03/2021    9:10 AM 02/15/2021    9:10 AM  Fall Risk   Falls in the past year? 0 0 0 0  Number falls in past yr: 0 0 0 0  Injury with Fall?  0 0 0  Follow up Falls evaluation completed Falls evaluation completed Falls evaluation completed Falls evaluation completed    FALL RISK PREVENTION PERTAINING TO THE HOME: Home free of loose throw rugs in walkways, pet beds, electrical cords, etc? Yes  Adequate lighting in your home to reduce risk of falls? Yes   ASSISTIVE DEVICES UTILIZED TO PREVENT FALLS: Use of a cane, walker or w/c? No   TIMED UP AND GO: Was the test performed? No .   Cognitive Function:  Patient is alert and oriented x3.       Immunizations Immunization History  Administered Date(s) Administered   Influenza,inj,Quad PF,6+ Mos 09/03/2021   PFIZER(Purple Top)SARS-COV-2 Vaccination 12/30/2019, 01/20/2020,  11/20/2020   Screening Tests Health Maintenance  Topic Date Due   PAP SMEAR-Modifier  03/20/2022 (Originally 01/05/1995)   Hepatitis C Screening  03/20/2022 (Originally 01/05/1992)   HIV Screening  03/20/2022 (Originally 01/04/1989)   TETANUS/TDAP  10/26/2022 (Originally 01/04/1993)   COVID-19 Vaccine (4 - Booster for Pfizer series) 12/23/2022 (Originally 01/15/2021)   INFLUENZA VACCINE  05/14/2022   COLONOSCOPY (Pts 45-56yrs Insurance coverage will need to be confirmed)  11/01/2028   HPV VACCINES  Aged Out    Health Maintenance  There are no preventive care reminders to display for this patient.  Lung Cancer Screening: (Low Dose CT Chest recommended if Age 84-80 years, 30 pack-year currently smoking OR have quit w/in 15years.) does not qualify.   Vision Screening: Recommended annual ophthalmology exams for early detection of glaucoma and other disorders of the eye.  Dental Screening: Recommended annual dental exams for proper oral hygiene  Community Resource Referral / Chronic Care Management: CRR required this visit?  No   CCM required this visit?  No      Plan:   Keep all routine maintenance appointments.   I have personally reviewed and noted the following in the patient's chart:   Medical and social history Use of alcohol, tobacco or illicit drugs  Current medications and supplements including opioid prescriptions. Patient is not currently taking opioid prescriptions. Functional ability and status Nutritional status Physical activity Advanced directives List of other physicians Hospitalizations, surgeries, and ER visits in previous 12 months Vitals Screenings to include cognitive, depression, and falls Referrals and appointments  In addition, I have reviewed and discussed with patient certain preventive protocols, quality metrics, and best practice recommendations. A written personalized care plan for preventive services as well as general preventive health  recommendations were provided to patient.     Ashok Pall, LPN   1/61/0960

## 2022-02-26 NOTE — Patient Instructions (Addendum)
?  Ms. Filippi , ?Thank you for taking time to come for your Medicare Wellness Visit. I appreciate your ongoing commitment to your health goals. Please review the following plan we discussed and let me know if I can assist you in the future.  ? ?These are the goals we discussed: ? Goals   ? ?  Maintain healthy lifestyle   ?  Stay active ?Healthy diet ?  ? ?  ?  ?This is a list of the screening recommended for you and due dates:  ?Health Maintenance  ?Topic Date Due  ? Pap Smear  03/20/2022*  ? Hepatitis C Screening: USPSTF Recommendation to screen - Ages 18-79 yo.  03/20/2022*  ? HIV Screening  03/20/2022*  ? Tetanus Vaccine  10/26/2022*  ? COVID-19 Vaccine (4 - Booster for Pfizer series) 12/23/2022*  ? Flu Shot  05/14/2022  ? Colon Cancer Screening  11/01/2028  ? HPV Vaccine  Aged Out  ?*Topic was postponed. The date shown is not the original due date.  ?  ?

## 2022-03-06 ENCOUNTER — Encounter: Payer: 59 | Admitting: Obstetrics and Gynecology

## 2022-03-15 ENCOUNTER — Other Ambulatory Visit: Payer: Self-pay | Admitting: Internal Medicine

## 2022-03-15 MED ORDER — CRYSELLE-28 0.3-30 MG-MCG PO TABS: 1.0000 | ORAL_TABLET | Freq: Every day | ORAL | 3 refills | Status: DC

## 2022-03-20 ENCOUNTER — Ambulatory Visit (INDEPENDENT_AMBULATORY_CARE_PROVIDER_SITE_OTHER): Payer: 59 | Admitting: Obstetrics

## 2022-03-20 ENCOUNTER — Encounter: Payer: Self-pay | Admitting: Obstetrics

## 2022-03-20 VITALS — BP 130/84 | HR 105 | Ht 63.0 in | Wt 293.2 lb

## 2022-03-20 DIAGNOSIS — D219 Benign neoplasm of connective and other soft tissue, unspecified: Secondary | ICD-10-CM | POA: Diagnosis not present

## 2022-03-20 DIAGNOSIS — Z01419 Encounter for gynecological examination (general) (routine) without abnormal findings: Secondary | ICD-10-CM

## 2022-03-20 NOTE — Progress Notes (Signed)
SUBJECTIVE  HPI  Shelia Chapman is a 46 y.o.-year-old female who presents for an annual physical and to discuss her hernia and fibroids. She had a CT in March that showed multiple large fibroids measuring a total of 24.2 x 15.7 cm and a hernia measuring 5 cm in a previous surgical scar. She has been on Cryselle for about a year, which has greatly reduced her symptoms. She reports that she now has regular monthly periods with a normal amount of bleeding and no pain. She denies abnormal bleeding, discharge, pelvic pain, dyspareunia, and urinary symptoms. She has no other health concerns today.  Medical/Surgical History Past Medical History:  Diagnosis Date   Anemia of unknown etiology 02/28/2016   Chicken pox    Depression    Frequent headaches    Glaucoma    Multiple sclerosis ([place])    Sleep apnea    Urine incontinence    Past Surgical History:  Procedure Laterality Date   COLONOSCOPY WITH PROPOFOL N/A 11/01/2021   Procedure: COLONOSCOPY WITH PROPOFOL;  Surgeon: Jonathon Bellows, MD;  Location: [facility] ENDOSCOPY;  Service: Gastroenterology;  Laterality: N/A;   ESOPHAGOGASTRODUODENOSCOPY N/A 11/01/2021   Procedure: ESOPHAGOGASTRODUODENOSCOPY (EGD);  Surgeon: Jonathon Bellows, MD;  Location: [facility] ENDOSCOPY;  Service: Gastroenterology;  Laterality: N/A;   UTERINE FIBROID SURGERY      Social History Lives with her sister Work: on disability for MS Exercise: minimal Substances: denies EtOH, vape, tobacco, and recreational drugs  Obstetric History OB History     Gravida  0   Para  0   Term  0   Preterm  0   AB  0   Living  0      SAB  0   IAB  0   Ectopic  0   Multiple  0   Live Births  0            GYN/Menstrual History Patient's last menstrual period was 03/14/2022 (exact date). Last Pap: Last year. Normal per pt Contraception: Cryselle  Prevention Endorses regular dental and eye exams Mammogram: scheduled for tomorrow  Current Medications Outpatient  Medications Prior to Visit  Medication Sig   baclofen (LIORESAL) 10 MG tablet Take 10 mg by mouth 3 (three) times daily as needed.   Cholecalciferol 1.25 MG (50000 UT) capsule Take 1 capsule (50,000 Units total) by mouth once a week. D3 x 6 months   CRYSELLE-28 0.3-30 MG-MCG tablet Take 1 tablet by mouth daily. Use as directed   FEROSUL 325 (65 Fe) MG tablet Take 325 mg by mouth daily.   gabapentin (NEURONTIN) 100 MG capsule 200 mg 2 (two) times daily. But taking M, W, F   GILENYA 0.5 MG CAPS Take 1 capsule by mouth daily.   LUMIGAN 0.01 % SOLN 1 drop at bedtime.   pravastatin (PRAVACHOL) 20 MG tablet Take 1 tablet (20 mg total) by mouth daily. After 6 pm   vitamin B-12 (CYANOCOBALAMIN) 1000 MCG tablet Take 1 tablet (1,000 mcg total) by mouth daily.   No facility-administered medications prior to visit.       ROS History obtained from the patient General ROS: negative for - chills, fatigue, or hot flashes Psychological ROS: negative for - anxiety or depression Ophthalmic ROS: negative for - decreased vision ENT ROS: negative for - headaches or sore throat Hematological and Lymphatic ROS: negative for - bleeding problems, bruising, or swollen lymph nodes Endocrine ROS: negative for - breast changes or palpitations Breast ROS: negative for breast lumps Respiratory ROS: no  cough, shortness of breath, or wheezing Cardiovascular ROS: no chest pain or dyspnea on exertion Gastrointestinal ROS: no abdominal pain, change in bowel habits, or black or bloody stools Genito-Urinary ROS: no dysuria, trouble voiding, or hematuria Musculoskeletal ROS: negative Dermatological ROS: negative     02/26/2022    9:56 AM 10/26/2021   10:12 AM 09/26/2021    9:13 AM 09/03/2021    9:10 AM 02/15/2021    9:10 AM  Depression screen PHQ 2/9  Decreased Interest 0 0 0 0 0  Down, Depressed, Hopeless 0 0 0 0 0  PHQ - 2 Score 0 0 0 0 0  Altered sleeping  1   0  Tired, decreased energy  1   0  Change in  appetite  0   0  Feeling bad or failure about yourself   0   0  Trouble concentrating  0   0  Moving slowly or fidgety/restless  1   0  Suicidal thoughts  0   0  PHQ-9 Score  3   0  Difficult doing work/chores     Not difficult at all     OBJECTIVE  Last Weight  Most recent update: 03/20/2022  1:32 PM    Weight  133 kg (293 lb 3.2 oz)             Body mass index is 51.94 kg/m.    BP 130/84   Pulse (!) 105   Ht '5\' 3"'$  (1.6 m)   Wt 293 lb 3.2 oz (133 kg)   LMP 03/14/2022 (Exact Date)   BMI 51.94 kg/m  General appearance: alert, cooperative, and appears stated age Head: Normocephalic, without obvious abnormality, atraumatic Eyes: negative findings: lids and lashes normal and conjunctivae and sclerae normal Neck: no adenopathy, supple, symmetrical, trachea midline, and thyroid not enlarged, symmetric, no tenderness/mass/nodules Lungs: clear to auscultation bilaterally Breasts:  deferred, mammogram tomorrow Heart: regular rate and rhythm, S1, S2 normal, no murmur, click, rub or gallop Abdomen:  soft, non-tender, normal bowel sounds. Uterus firm and irregular, enlarged to approximately 5 cm above umbilicus. Pelvic: deferred Extremities: extremities normal, atraumatic, no cyanosis or edema Pulses: 2+ and symmetric Skin: Skin color, texture, turgor normal. No rashes or lesions Lymph nodes: Cervical, supraclavicular, and axillary nodes normal.  ASSESSMENT  1) Annual exam 2) Large fibroids  PLAN 1) Physical exam as noted. Declines STI screening and routine lab work. Discussed healthy lifestyle choices. 2) Refer to MD for surgical consult. Discussed myomectomy, hysterectomy, [place]. Will likely need this done prior to hernia repair.   Lloyd Huger, CNM

## 2022-03-21 ENCOUNTER — Encounter: Payer: Self-pay | Admitting: Internal Medicine

## 2022-03-21 ENCOUNTER — Ambulatory Visit (INDEPENDENT_AMBULATORY_CARE_PROVIDER_SITE_OTHER): Payer: 59 | Admitting: Internal Medicine

## 2022-03-21 ENCOUNTER — Other Ambulatory Visit (HOSPITAL_COMMUNITY)
Admission: RE | Admit: 2022-03-21 | Discharge: 2022-03-21 | Disposition: A | Discharge status: Home / Self Care | Payer: 59 | Source: Ambulatory Visit | Attending: Internal Medicine | Admitting: Internal Medicine

## 2022-03-21 VITALS — BP 120/80 | HR 91 | Temp 98.0°F | Resp 14 | Ht 63.0 in | Wt 291.4 lb

## 2022-03-21 DIAGNOSIS — Z1151 Encounter for screening for human papillomavirus (HPV): Secondary | ICD-10-CM | POA: Diagnosis not present

## 2022-03-21 DIAGNOSIS — D259 Leiomyoma of uterus, unspecified: Secondary | ICD-10-CM

## 2022-03-21 DIAGNOSIS — Z0000 Encounter for general adult medical examination without abnormal findings: Secondary | ICD-10-CM | POA: Diagnosis not present

## 2022-03-21 DIAGNOSIS — Z124 Encounter for screening for malignant neoplasm of cervix: Secondary | ICD-10-CM

## 2022-03-21 DIAGNOSIS — Z01419 Encounter for gynecological examination (general) (routine) without abnormal findings: Secondary | ICD-10-CM | POA: Insufficient documentation

## 2022-03-21 DIAGNOSIS — K439 Ventral hernia without obstruction or gangrene: Secondary | ICD-10-CM | POA: Diagnosis not present

## 2022-03-21 DIAGNOSIS — D219 Benign neoplasm of connective and other soft tissue, unspecified: Secondary | ICD-10-CM | POA: Insufficient documentation

## 2022-03-21 NOTE — Progress Notes (Signed)
Chief Complaint  Patient presents with   Follow-up    Feeling well today, denies any pain. Pap due today   F/u  1. Pap today not had in a while pending surgical consult for fibroids s/p fibroid surgery but [facility] thinks needs another myomectomy  2. Abnormal ct 12/2021 with fibroids and 5.1 cm ventral hernia consider surgical consult in the future not painful but large     Review of Systems  Constitutional:  Negative for weight loss.  HENT:  Negative for hearing loss.   Eyes:  Negative for blurred vision.  Respiratory:  Negative for shortness of breath.   Cardiovascular:  Negative for chest pain.  Gastrointestinal:  Negative for abdominal pain and blood in stool.  Genitourinary:  Negative for dysuria.  Musculoskeletal:  Negative for falls and joint pain.  Skin:  Negative for rash.  Neurological:  Negative for headaches.  Psychiatric/Behavioral:  Negative for depression.    Past Medical History:  Diagnosis Date   Anemia of unknown etiology 02/28/2016   Chicken pox    Depression    Frequent headaches    Glaucoma    Multiple sclerosis ([place])    Sleep apnea    Urine incontinence    Past Surgical History:  Procedure Laterality Date   COLONOSCOPY WITH PROPOFOL N/A 11/01/2021   Procedure: COLONOSCOPY WITH PROPOFOL;  Surgeon: Jonathon Bellows, MD;  Location: [facility] ENDOSCOPY;  Service: Gastroenterology;  Laterality: N/A;   ESOPHAGOGASTRODUODENOSCOPY N/A 11/01/2021   Procedure: ESOPHAGOGASTRODUODENOSCOPY (EGD);  Surgeon: Jonathon Bellows, MD;  Location: [facility] ENDOSCOPY;  Service: Gastroenterology;  Laterality: N/A;   UTERINE FIBROID SURGERY     Family History  Problem Relation Age of Onset   Depression Mother    Diabetes Mother    Hyperlipidemia Mother    Hypertension Mother    Uterine cancer Mother    Diabetes Sister    Hypertension Sister    Breast cancer Neg Hx    Social History   Socioeconomic History   Marital status: Single    Spouse name: Not on file   Number of  children: Not on file   Years of education: Not on file   Highest education level: Not on file  Occupational History   Not on file  Tobacco Use   Smoking status: Never   Smokeless tobacco: Never  Vaping Use   Vaping Use: Never used  Substance and Sexual Activity   Alcohol use: Never   Drug use: Never   Sexual activity: Not Currently  Other Topics Concern   Not on file  Social History Narrative   Not on file   Social Determinants of Health   Financial Resource Strain: Low Risk  (02/26/2022)   Overall Financial Resource Strain (CARDIA)    Difficulty of Paying Living Expenses: Not hard at all  Food Insecurity: No Food Insecurity (02/26/2022)   Hunger Vital Sign    Worried About Running Out of Food in the Last Year: Never true    Ran Out of Food in the Last Year: Never true  Transportation Needs: No Transportation Needs (02/26/2022)   PRAPARE - Hydrologist (Medical): No    Lack of Transportation (Non-Medical): No  Physical Activity: Unknown (02/26/2022)   Exercise Vital Sign    Days of Exercise per Week: 4 days    Minutes of Exercise per Session: Not on file  Stress: No Stress Concern Present (02/26/2022)   [place]  Feeling of Stress : Not at all  Social Connections: Unknown (02/26/2022)   Social Connection and Isolation Panel [NHANES]    Frequency of Communication with Friends and Family: Not on file    Frequency of Social Gatherings with Friends and Family: More than three times a week    Attends Religious Services: Not on file    Active Member of Clubs or Organizations: Not on file    Attends Archivist Meetings: Not on file    Marital Status: Not on file  Intimate Partner Violence: Not At Risk (02/26/2022)   Humiliation, Afraid, Rape, and Kick questionnaire    Fear of Current or Ex-Partner: No    Emotionally Abused: No    Physically Abused: No    Sexually Abused:  No   Current Meds  Medication Sig   baclofen (LIORESAL) 10 MG tablet Take 10 mg by mouth 3 (three) times daily as needed.   Cholecalciferol 1.25 MG (50000 UT) capsule Take 1 capsule (50,000 Units total) by mouth once a week. D3 x 6 months   CRYSELLE-28 0.3-30 MG-MCG tablet Take 1 tablet by mouth daily. Use as directed   FEROSUL 325 (65 Fe) MG tablet Take 325 mg by mouth daily.   gabapentin (NEURONTIN) 100 MG capsule 200 mg 2 (two) times daily. But taking M, W, F   GILENYA 0.5 MG CAPS Take 1 capsule by mouth daily.   LUMIGAN 0.01 % SOLN 1 drop at bedtime.   pravastatin (PRAVACHOL) 20 MG tablet Take 1 tablet (20 mg total) by mouth daily. After 6 pm   vitamin B-12 (CYANOCOBALAMIN) 1000 MCG tablet Take 1 tablet (1,000 mcg total) by mouth daily.   No Known Allergies Recent Results (from the past 2160 hour(s))  TSH     Status: None   Collection Time: 02/15/22  8:10 AM  Result Value Ref Range   TSH 2.56 0.35 - 5.50 uIU/mL  CBC with Differential/Platelet     Status: None   Collection Time: 02/15/22  8:10 AM  Result Value Ref Range   WBC 6.2 4.0 - 10.5 K/uL   RBC 4.45 3.87 - 5.11 Mil/uL   Hemoglobin 13.1 12.0 - 15.0 g/dL   HCT 39.7 36.0 - 46.0 %   MCV 89.1 78.0 - 100.0 fl   MCHC 33.1 30.0 - 36.0 g/dL   RDW 14.2 11.5 - 15.5 %   Platelets 312.0 150.0 - 400.0 K/uL   Neutrophils Relative % 72.0 43.0 - 77.0 %   Lymphocytes Relative 17.3 12.0 - 46.0 %   Monocytes Relative 7.3 3.0 - 12.0 %   Eosinophils Relative 3.2 0.0 - 5.0 %   Basophils Relative 0.2 0.0 - 3.0 %   Neutro Abs 4.5 1.4 - 7.7 K/uL   Lymphs Abs 1.1 0.7 - 4.0 K/uL   Monocytes Absolute 0.5 0.1 - 1.0 K/uL   Eosinophils Absolute 0.2 0.0 - 0.7 K/uL   Basophils Absolute 0.0 0.0 - 0.1 K/uL  Lipid panel     Status: Abnormal   Collection Time: 02/15/22  8:10 AM  Result Value Ref Range   Cholesterol 231 (H) 0 - 200 mg/dL    Comment: ATP III Classification       Desirable:  < 200 mg/dL               Borderline High:  200 - 239 mg/dL           High:  > = 240 mg/dL   Triglycerides 128.0 0.0 - 149.0 mg/dL  Comment: Normal:  <150 mg/dLBorderline High:  150 - 199 mg/dL   HDL 30.70 (L) >39.00 mg/dL   VLDL 25.6 0.0 - 40.0 mg/dL   LDL Cholesterol 175 (H) 0 - 99 mg/dL   Total CHOL/HDL Ratio 8     Comment:                Men          Women1/2 Average Risk     3.4          3.3Average Risk          5.0          4.42X Average Risk          9.6          7.13X Average Risk          15.0          11.0                       NonHDL 200.67     Comment: NOTE:  Non-HDL goal should be 30 mg/dL higher than patient's LDL goal (i.e. LDL goal of < 70 mg/dL, would have non-HDL goal of < 100 mg/dL)  IBC + Ferritin     Status: Abnormal   Collection Time: 02/15/22  8:10 AM  Result Value Ref Range   Iron 54 42 - 145 ug/dL   Transferrin 270.0 212.0 - 360.0 mg/dL   Saturation Ratios 14.3 (L) 20.0 - 50.0 %   Ferritin 15.7 10.0 - 291.0 ng/mL   TIBC 378.0 250.0 - 450.0 mcg/dL  Comprehensive metabolic panel     Status: Abnormal   Collection Time: 02/15/22  8:10 AM  Result Value Ref Range   Sodium 138 135 - 145 mEq/L   Potassium 4.5 3.5 - 5.1 mEq/L   Chloride 102 96 - 112 mEq/L   CO2 27 19 - 32 mEq/L   Glucose, Bld 132 (H) 70 - 99 mg/dL   BUN 15 6 - 23 mg/dL   Creatinine, Ser 0.98 0.40 - 1.20 mg/dL   Total Bilirubin 0.6 0.2 - 1.2 mg/dL   Alkaline Phosphatase 66 39 - 117 U/L   AST 12 0 - 37 U/L   ALT 13 0 - 35 U/L   Total Protein 6.7 6.0 - 8.3 g/dL   Albumin 4.1 3.5 - 5.2 g/dL   GFR 68.44 >60.00 mL/min    Comment: Calculated using the CKD-EPI Creatinine Equation (2021)   Calcium 9.0 8.4 - 10.5 mg/dL   Objective  Body mass index is 51.62 kg/m. Wt Readings from Last 3 Encounters:  03/21/22 291 lb 6.4 oz (132.2 kg)  03/20/22 293 lb 3.2 oz (133 kg)  02/26/22 (!) 302 lb (137 kg)   Temp Readings from Last 3 Encounters:  03/21/22 98 F (36.7 C) (Oral)  01/09/22 97.6 F (36.4 C) (Oral)  12/19/21 97.9 F (36.6 C) (Oral)   BP Readings from  Last 3 Encounters:  03/21/22 120/80  03/20/22 130/84  01/09/22 (!) 153/90   Pulse Readings from Last 3 Encounters:  03/21/22 91  03/20/22 (!) 105  01/09/22 85    Physical Exam Vitals and nursing note reviewed.  Constitutional:      Appearance: Normal appearance. She is well-developed and well-groomed.  HENT:     Head: Normocephalic and atraumatic.  Eyes:     Conjunctiva/sclera: Conjunctivae normal.     Pupils: Pupils are equal, round, and reactive to light.  Cardiovascular:  Rate and Rhythm: Normal rate and regular rhythm.     Heart sounds: Normal heart sounds. No murmur heard. Pulmonary:     Effort: Pulmonary effort is normal.     Breath sounds: Normal breath sounds.  Abdominal:     General: Abdomen is flat. Bowel sounds are normal.     Tenderness: There is no abdominal tenderness.     Hernia: There is no hernia in the left inguinal area or right inguinal area.  Genitourinary:    Pubic Area: No rash.      Labia:        Right: No rash.        Left: No rash.      Cervix: Cervical bleeding present.     Uterus: Normal.      Adnexa: Right adnexa normal and left adnexa normal.     Comments: Just finished cycle   Musculoskeletal:        General: No tenderness.  Skin:    General: Skin is warm and dry.  Neurological:     General: No focal deficit present.     Mental Status: She is alert and oriented to person, place, and time. Mental status is at baseline.     Cranial Nerves: Cranial nerves 2-12 are intact.     Motor: Motor function is intact.     Coordination: Coordination is intact.     Gait: Gait is intact.  Psychiatric:        Attention and Perception: Attention and perception normal.        Mood and Affect: Mood and affect normal.        Speech: Speech normal.        Behavior: Behavior normal. Behavior is cooperative.        Thought Content: Thought content normal.        Cognition and Memory: Cognition and memory normal.        Judgment: Judgment normal.      Assessment  Plan  Annual physical exam Flu utd  3/3 covid shots  Tdap declines 12/19/21    Mammogram 02/28/21 negative ordered pt has to schedule  Colonoscopy 11/01/21 repeat in 7 years Dr. Vicente Males  Pap has all female parts  Pap today    Rec healthy diet and exercise    Ventral hernia without obstruction or gangrene  Consider surgical consult in the future  Fibroids pending myomectomy with Dr. Marcelline Mates    Provider: Dr. Olivia Mackie McLean-Scocuzza-Internal Medicine

## 2022-03-21 NOTE — Addendum Note (Signed)
Addended by: Orland Mustard on: 03/21/2022 09:58 AM   Modules accepted: Orders

## 2022-03-22 ENCOUNTER — Encounter: Payer: 59 | Admitting: Obstetrics

## 2022-03-25 ENCOUNTER — Other Ambulatory Visit: Payer: Self-pay | Admitting: Internal Medicine

## 2022-03-25 DIAGNOSIS — B9689 Other specified bacterial agents as the cause of diseases classified elsewhere: Secondary | ICD-10-CM

## 2022-03-25 LAB — CYTOLOGY - PAP
Adequacy: ABSENT
Comment: NEGATIVE
Diagnosis: NEGATIVE
Diagnosis: REACTIVE
High risk HPV: NEGATIVE

## 2022-03-25 MED ORDER — METRONIDAZOLE 500 MG PO TABS: 500.0000 mg | ORAL_TABLET | Freq: Two times a day (BID) | ORAL | 0 refills | Status: DC

## 2022-04-02 ENCOUNTER — Other Ambulatory Visit: Payer: Self-pay | Admitting: Internal Medicine

## 2022-04-02 DIAGNOSIS — N760 Acute vaginitis: Secondary | ICD-10-CM

## 2022-04-04 ENCOUNTER — Encounter: Payer: 59 | Admitting: Obstetrics and Gynecology

## 2022-04-15 ENCOUNTER — Ambulatory Visit
Admission: RE | Admit: 2022-04-15 | Discharge: 2022-04-15 | Disposition: A | Discharge status: Home / Self Care | Payer: 59 | Source: Ambulatory Visit | Attending: Internal Medicine | Admitting: Internal Medicine

## 2022-04-15 DIAGNOSIS — Z1231 Encounter for screening mammogram for malignant neoplasm of breast: Secondary | ICD-10-CM | POA: Insufficient documentation

## 2022-04-17 NOTE — Progress Notes (Signed)
GYNECOLOGY PROGRESS NOTE  Subjective:    Patient ID: Shelia Chapman, female    DOB: 1976/05/18, 46 y.o.   MRN: [id number]  HPI  Patient is a 46 y.o. G0P0000 female who presents for consultation for surgery due to large uterine fibroids and  abdominal pain.  She was referred by her PCP and was seen initially by Guadlupe Spanish, CNM of [facility].  She also has a history of heavy menstrual cycles and anemia, currently being managed with combined OCPs (initiated on medication by her PCP) and p.o. iron supplementation. She has been bothered with generalized abdominal pain for the past 6 months. Severe pelvic pain when standing.  Pain is intermittent, and occurs every 2 to 3 months, lasting for several days and then resolving.  Of note, Shelia Chapman has a previous history of myomectomy in 2021.    Additionally, patient also was noted to have a large ventral hernia on her most recent CT scan performed in March.  She has been seen by general surgery who recommend that due to her obesity she lose weight before surgical intervention is approached.    The following portions of the patient's history were reviewed and updated as appropriate:   She  has a past medical history of Anemia of unknown etiology (02/28/2016), Chicken pox, Depression, Frequent headaches, Glaucoma, Multiple sclerosis (HCC), Sleep apnea, and Urine incontinence.  She  has a past surgical history that includes Uterine fibroid surgery; Colonoscopy with propofol (N/A, 11/01/2021); and Esophagogastroduodenoscopy (N/A, 11/01/2021).  Her family history includes Depression in her mother; Diabetes in her mother and sister; Hyperlipidemia in her mother; Hypertension in her mother and sister; Uterine cancer in her mother.  She  reports that she has never smoked. She has never used smokeless tobacco. She reports that she does not drink alcohol and does not use drugs.   Current Outpatient Medications on File Prior to Visit  Medication Sig  Dispense Refill   baclofen (LIORESAL) 10 MG tablet Take 10 mg by mouth 3 (three) times daily as needed.     Cholecalciferol 1.25 MG (50000 UT) capsule Take 1 capsule (50,000 Units total) by mouth once a week. D3 x 6 months 13 capsule 1   CRYSELLE-28 0.3-30 MG-MCG tablet Take 1 tablet by mouth daily. Use as directed 90 tablet 3   FEROSUL 325 (65 Fe) MG tablet Take 325 mg by mouth daily.     gabapentin (NEURONTIN) 100 MG capsule 200 mg 2 (two) times daily. But taking M, W, F     GILENYA 0.5 MG CAPS Take 1 capsule by mouth daily.     LUMIGAN 0.01 % SOLN 1 drop at bedtime.     phentermine 37.5 MG capsule Take 37.5 mg by mouth every morning.     pravastatin (PRAVACHOL) 20 MG tablet Take 1 tablet (20 mg total) by mouth daily. After 6 pm 90 tablet 3   vitamin B-12 (CYANOCOBALAMIN) 1000 MCG tablet Take 1 tablet (1,000 mcg total) by mouth daily. 90 tablet 1   No current facility-administered medications on file prior to visit.   She has No Known Allergies..  Review of Systems Pertinent items noted in HPI and remainder of comprehensive ROS otherwise negative.   Objective:    Blood pressure 132/81, pulse 92, resp. rate 16, height 5\' 3"  (1.6 m), weight 289 lb 1.6 oz (131.1 kg), last menstrual period 03/14/2022.  Body mass index is 51.21 kg/m.  General appearance: alert, no distress, and morbidly obese Abdomen: normal findings: bowel sounds normal  and soft, non-tender and abnormal findings:  mass, located arising from the pelvis into the mid abdomen (~24 cm), mobile with irregular contours.  Well-healed infraumbilical midline scar present.  Ventral hernia palpable in midline ~ 3-4 cm above umbilicus.  Pelvic: deferred as patient notes that she is currently bleeding Extremities: extremities normal, atraumatic, no cyanosis or edema Neurologic: Grossly normal   Imaging: (CT scan performed 01/02/2022) EXAM: CT ABDOMEN AND PELVIS WITHOUT CONTRAST   TECHNIQUE: Multidetector CT imaging of the abdomen  and pelvis was performed following the standard protocol without IV contrast.   RADIATION DOSE REDUCTION: This exam was performed according to the departmental dose-optimization program which includes automated exposure control, adjustment of the mA and/or kV according to patient size and/or use of iterative reconstruction technique.   COMPARISON:  None.   FINDINGS: Lower chest: Unremarkable.   Hepatobiliary: No focal liver abnormality is seen. No gallstones, gallbladder wall thickening, or biliary dilatation.   Pancreas: Unremarkable. No pancreatic ductal dilatation or surrounding inflammatory changes.   Spleen: Normal in size without focal abnormality.   Adrenals/Urinary Tract: Normal appearing adrenal glands, left kidney, left ureter and urinary bladder. The right kidney is not visualized.   Stomach/Bowel: Minimal right colon diverticulosis without evidence of diverticulitis. The appendix is not visualized. Unremarkable stomach and small bowel.   Vascular/Lymphatic: No significant vascular findings are present. No enlarged abdominal or pelvic lymph nodes.   Reproductive: Multiple large, oval, mildly heterogeneous uterine masses. The largest is on the right and contains dystrophic calcifications. The conglomeration of masses measures 24.2 x 15.7 cm on image number 61/2 and 20.0 cm in length on sagittal image number 104/6.   The ovaries are laterally displaced and flattened by the enlarged uterus with no gross ovarian abnormality seen.   Other: Infraumbilical midline surgical scar. Supraumbilical ventral hernia with the hernia defect measuring 5.1 cm in transverse diameter with a moderate amount of bilobed herniated fat in the overlying subcutaneous fat.   Musculoskeletal: Mild dextroconvex thoracolumbar scoliosis. Mild lower thoracic spine degenerative changes. Minimal lumbar spine degenerative changes.   IMPRESSION: 1. Markedly enlarged, fibroid uterus measuring  24.2 x 20.0 x 15.7 cm. 2. Right renal agenesis. 3. Moderate-sized supraumbilical ventral hernia containing herniated fat. 4. Minimal right colon diverticulosis.  Assessment:   1. Uterine leiomyoma, unspecified location   2. Ventral hernia without obstruction or gangrene   3. Abnormal uterine bleeding   4. History of anemia   5. Morbid obesity with BMI of 50.0-59.9, adult (HCC)      Plan:  1. Uterine leiomyoma, unspecified location -Lengthy discussion had regarding management options of uterine fibroids.  Based on patient's age, size of fibroids, and current lack of desire for future fertility (although patient has never had children), I would recommend hysterectomy at this time.  I discussed other options for management including uterine artery embolization, myomectomy, however did form of the possibility of future fibroids developing after each of these methods.  Patient understands and notes that she would be fine with proceeding with hysterectomy if this is the best option for her.  I discussed the different methods of performing a hysterectomy.  Based on her BMI, body habitus, and size of the fibroids patient would most likely benefit from abdominal hysterectomy however as patient is trying to lose weight, may be a better candidate for robotic hysterectomy, however would also likely need to initiate a medication such as Depo-Lupron to decrease the size of the fibroids as well.  2. Ventral hernia without obstruction or  gangrene -Patient has been seen by general surgery.  They recommend weight loss prior to repair of the hernia due to concerns for recurrence as well as surgical complications.  Patient notes that she has been started on a weight loss medication by her PCP (initiated approximately 1.5 to 2 months ago). -I discussed that it may be possible depending on the amount of desired weight loss that both surgeries (hysterectomy and hernia repair) could be performed as a single procedure.   I will follow-up with general surgery in several months as patient continues to work on [employer].  3. Abnormal uterine bleeding -Currently being managed by PCP with combined OCPs.  I would strongly consider use of medication such as Depo-Lupron or Myfembree to potentially assist in reduction in size of the uterus fibroids.  4. History of anemia -Anemia currently being managed with OCPs for management of her bleeding, as well as p.o. iron supplementation.  Patient advised to continue current management.   5. Morbid obesity with BMI of 50.0-59.9, adult (HCC) -Reports that she is working to lose weight in order to have surgery.  Was given weight loss medication (however cannot recall the name of the medication) ~ 2 months ago.  Reports that she has lost approximately 24 lbs so far.  Is working to reduce her intake of fast food and sodas.   Patient to follow-up in approximately 2 months, can reassess readiness for surgery at that time.   A total of 48 minutes were spent during this encounter, including review of previous progress notes, recent imaging and labs, face-to-face with time with patient involving counseling and coordination of care, as well as documentation for current visit.   Hildred Laser, MD [facility].

## 2022-04-18 ENCOUNTER — Ambulatory Visit (INDEPENDENT_AMBULATORY_CARE_PROVIDER_SITE_OTHER): Payer: 59 | Admitting: Obstetrics and Gynecology

## 2022-04-18 ENCOUNTER — Encounter: Payer: Self-pay | Admitting: Obstetrics and Gynecology

## 2022-04-18 VITALS — BP 132/81 | HR 92 | Resp 16 | Ht 63.0 in | Wt 289.1 lb

## 2022-04-18 DIAGNOSIS — Z862 Personal history of diseases of the blood and blood-forming organs and certain disorders involving the immune mechanism: Secondary | ICD-10-CM

## 2022-04-18 DIAGNOSIS — K439 Ventral hernia without obstruction or gangrene: Secondary | ICD-10-CM

## 2022-04-18 DIAGNOSIS — N939 Abnormal uterine and vaginal bleeding, unspecified: Secondary | ICD-10-CM

## 2022-04-18 DIAGNOSIS — D259 Leiomyoma of uterus, unspecified: Secondary | ICD-10-CM | POA: Diagnosis not present

## 2022-04-18 DIAGNOSIS — Z6841 Body Mass Index (BMI) 40.0 and over, adult: Secondary | ICD-10-CM

## 2022-04-18 NOTE — Patient Instructions (Signed)
Abdominal Hysterectomy Abdominal hysterectomy is a surgical procedure to remove the uterus. The uterus is an organ that holds the baby as it develops before birth. This surgery may be done if a woman has certain problems of the uterus. These may include cancer or growths (tumors or fibroids). Other problems include infection, chronic pain, severe bleeding, or other problems of the menstrual cycle. The procedure may also be done if: The uterus has slipped down into the vagina (uterine prolapse). The tissue that lines the uterus is growing outside of its normal location (endometriosis). Depending on the reason for this procedure, other reproductive organs may also be removed. These could include: The lowest part of the uterus (cervix), which opens into the vagina. The organs that make eggs (ovaries). The tubes that connect the ovaries to the uterus (fallopian tubes). Tell your health care provider about: Any allergies you have. All medicines you are taking, including vitamins, herbs, eye drops, creams, and over-the-counter medicines. Any problems you or family members have had with anesthetic medicines. Any blood disorders you have. Any surgeries you have had. Any medical conditions you have or have had. Whether you are pregnant or may be pregnant. What are the risks? Generally, this is a safe procedure. However, problems may occur, including: Bleeding. Infection. Allergic reactions to medicines or dyes. Damage to nearby structures or organs. Nerve injury. Decreased interest in sex or pain during sex. Blood clots that can break free and travel to your lungs. What happens before the procedure? Staying hydrated Follow instructions from your health care provider about hydration, which may include: Up to 2 hours before the procedure - you may continue to drink clear liquids, such as water, clear fruit juice, black coffee, and plain tea. Eating and drinking restrictions Follow instructions  from your health care provider about eating and drinking, which may include: 8 hours before the procedure - stop eating heavy meals or foods, such as meat, fried foods, or fatty foods. 6 hours before the procedure - stop eating light meals or foods, such as toast or cereal. 6 hours before the procedure - stop drinking milk or drinks that contain milk. 2 hours before the procedure - stop drinking clear liquids. Medicines Ask your health care provider about: Changing or stopping your regular medicines. This is especially important if you are taking diabetes medicines or blood thinners. Taking medicines such as aspirin and ibuprofen. These medicines can thin your blood. Do not take these medicines unless your health care provider tells you to take them. Taking over-the-counter medicines, vitamins, herbs, and supplements. You may be asked to take a medicine to empty your colon (bowel preparation). General instructions This procedure can affect the way you feel about yourself. Talk with your health care provider about the physical and emotional changes this procedure may cause. Do not use any products that contain nicotine or tobacco for at least 4 weeks before the procedure. These products include cigarettes, chewing tobacco, and vaping devices, such as e-cigarettes. If you need help quitting, ask your health care provider. Do not drink beverages that contain alcohol prior to the procedure. Alcohol can increase your risk of bleeding and complications. If you need help stopping, ask your health care provider. Plan to have a responsible adult take you home from the hospital or clinic. Ask your health care provider: How your surgery site will be marked. What steps will be taken to help prevent infection. These steps may include: Removing hair at the surgery site. Washing skin with a germ-killing  soap. Taking antibiotic medicine. What happens during the procedure? An IV will be inserted into one of  your veins. You will be given one or more of the following: A medicine to help you relax (sedative). A medicine to make you fall asleep (general anesthetic). A medicine that is injected into your spine to numb the area below and slightly above the injection site (spinal anesthetic). A medicine that is injected into an area of your body to numb everything below the injection site (regional anesthetic). Compression stockings will be placed on your legs to promote circulation. A thin, flexible tube (catheter) will be inserted to help drain your urine. An incision will be made through the skin in your lower abdomen. The incision may go side to side or up and down. Your uterus and any other organs that need to be removed will be carefully taken out. Bleeding will be controlled with clamps or stitches (sutures). Your incision will be closed with sutures, skin glue, or adhesive strips. A bandage (dressing) will be placed over the incision. The procedure may vary among health care providers and hospitals. What happens after the procedure? Your blood pressure, heart rate, breathing rate, and blood oxygen level will be monitored until you leave the hospital or clinic. You will be given pain medicine as needed. Ask your health care provider how long you will need to stay in the hospital after your procedure. You may have a liquid diet at first. You will most likely return to your usual diet the day after surgery. You will still have the urinary catheter in place. It will likely be removed the day after surgery. You may have to wear compression stockings. These stockings help to prevent blood clots and reduce swelling in your legs. You will be encouraged to walk as soon as possible. You will do breathing exercises or use a device to help keep your lungs clear. You may need to use a sanitary napkin for discharge from the vagina. Summary Abdominal hysterectomy is a surgical procedure to remove the uterus.  The uterus is the organ that holds a developing baby. This procedure can affect the way you feel about yourself. Talk with your health care provider about the physical and emotional changes this procedure may cause. You will be given pain medicine after the procedure. Ask your health care provider how long you will need to stay in the hospital after your procedure. This information is not intended to replace advice given to you by your health care provider. Make sure you discuss any questions you have with your health care provider. Document Revised: 06/01/2020 Document Reviewed: 06/01/2020 Elsevier Patient Education  [place].

## 2022-04-22 ENCOUNTER — Encounter: Payer: 59 | Admitting: Obstetrics

## 2022-05-06 ENCOUNTER — Other Ambulatory Visit: Payer: Self-pay | Admitting: Internal Medicine

## 2022-05-06 DIAGNOSIS — E559 Vitamin D deficiency, unspecified: Secondary | ICD-10-CM

## 2022-05-13 ENCOUNTER — Other Ambulatory Visit: Payer: Self-pay | Admitting: Internal Medicine

## 2022-05-13 DIAGNOSIS — E559 Vitamin D deficiency, unspecified: Secondary | ICD-10-CM

## 2022-06-18 NOTE — Progress Notes (Unsigned)
GYNECOLOGY PROGRESS NOTE  Subjective:    Patient ID: Shelia Chapman, female    DOB: 1975/12/03, 46 y.o.   MRN: [id number]  HPI  Patient is a 46 y.o. G0P0000 female who presents for follow up consultation for hysterectomy. Notes that she has not had any significant pain since her last visit 2 months ago. Was trying to work on weight loss. Also has a ventral hernia that she desires to have repaired.  Since her last visit, patient reports weight loss of ~ 5 lbs over the past 2-3 months of the medication. Does report taking medication every day.   The following portions of the patient's history were reviewed and updated as appropriate: allergies, current medications, past family history, past medical history, past social history, past surgical history, and problem list.  Review of Systems Pertinent items are noted in HPI.   Objective:   Blood pressure (!) 142/95, pulse 89, height '5\' 3"'$  (1.6 m), weight 286 lb (129.7 kg), last menstrual period 06/13/2022.  Body mass index is 50.66 kg/m.  Wt Readings from Last 3 Encounters:  06/19/22 286 lb (129.7 kg)  04/18/22 289 lb 1.6 oz (131.1 kg)  03/21/22 291 lb 6.4 oz (132.2 kg)    General appearance: alert, cooperative, and no distress Abdomen: normal findings: bowel sounds normal and soft, non-tender and abnormal findings:  mass, located arising from the pelvis into the mid abdomen (~24 cm), mobile with irregular contours.  Well-healed infraumbilical midline scar present.  Ventral hernia palpable in midline ~ 3-4 cm above umbilicus.  Pelvic: deferred.    Assessment:   1. Uterine leiomyoma, unspecified location   2. Morbid obesity with BMI of 50.0-59.9, adult ([place])   3. History of anemia   4. Ventral hernia without obstruction or gangrene   5. Hyperlipidemia, unspecified hyperlipidemia type      Plan:   1. Uterine leiomyoma, unspecified location - Enlarged uterus with fibroids. Further discussion had on plans for hysterectomy. Patient  notes that she is no longer experiencing the abdominal pain that she once was.  Discussed that due to size of current fibroids, if hysterectomy was undertaken, would likely require vertical abdominal incision such as where her previous incision scar is I also discussed the option of attempting to utilize medications to help shrink the fibroids such as Depot-Lupron, which could potentially give her the alternative option for minimally invasive approach.  I discussed that this depended on patient's timeline and current symptoms.  Patient notes that since she is no longer having major symptoms for her fibroids she is hoping to consider use of Depot-Lupron to help shrink her fibroids.  Discussed risks and benefits of the medication.  Will order, to use for total of 6 to 9 months. -We will follow back up in 3 months after Depot-Lupron used to fibroids. -Patient can discontinue use of her OCPs if Lupron is used.  2. Morbid obesity with BMI of 50.0-59.9, adult ([place]) -Patient with elevated BMI, notes that she has been trying to lose weight for her overall health as well as a decrease her risk of surgical complications and to be able to proceed with ventral hernia surgery.  Currently on Adipex, however has only had approximately 5 pound weight loss in the past 2 to 3 months.  I discussed that if she is taking the medication properly then in general she should have had a much more dramatic weight loss.  Advised that the Adipex may or may not be eating much for her weight  loss in the potentially try different medication.  We will attempt to prescribe Wegovy.  3. History of anemia -Currently controlled with use of combined OCPs.  4. Ventral hernia without obstruction or gangrene -Patient awaiting further weight loss in order to schedule her surgery for repair.  I discussed that over the next several months as she continues to use Lupron and if she is approved for the [facility] and has significant weight loss she could  potentially have both surgeries performed together.  We will reassess at a later time if more significant weight loss occurs.  5. Hyperlipidemia, unspecified hyperlipidemia type -Patient with history of hyperlipidemia, on Pravachol.   A total of 35 minutes were spent during this encounter, including review of previous progress notes, recent imaging and labs, face-to-face with time with patient involving counseling and coordination of care, as well as documentation for current visit.  Rubie Maid, MD [facility]

## 2022-06-19 ENCOUNTER — Ambulatory Visit (INDEPENDENT_AMBULATORY_CARE_PROVIDER_SITE_OTHER): Payer: 59 | Admitting: Obstetrics and Gynecology

## 2022-06-19 ENCOUNTER — Encounter: Payer: Self-pay | Admitting: Obstetrics and Gynecology

## 2022-06-19 VITALS — BP 142/95 | HR 89 | Ht 63.0 in | Wt 286.0 lb

## 2022-06-19 DIAGNOSIS — Z862 Personal history of diseases of the blood and blood-forming organs and certain disorders involving the immune mechanism: Secondary | ICD-10-CM | POA: Diagnosis not present

## 2022-06-19 DIAGNOSIS — K439 Ventral hernia without obstruction or gangrene: Secondary | ICD-10-CM | POA: Diagnosis not present

## 2022-06-19 DIAGNOSIS — E785 Hyperlipidemia, unspecified: Secondary | ICD-10-CM

## 2022-06-19 DIAGNOSIS — Z6841 Body Mass Index (BMI) 40.0 and over, adult: Secondary | ICD-10-CM

## 2022-06-19 DIAGNOSIS — D259 Leiomyoma of uterus, unspecified: Secondary | ICD-10-CM | POA: Diagnosis not present

## 2022-06-19 MED ORDER — SEMAGLUTIDE-WEIGHT MANAGEMENT 0.5 MG/0.5ML ~~LOC~~ SOAJ: 0.5000 mg | SUBCUTANEOUS | 0 refills | Status: DC

## 2022-06-19 MED ORDER — SEMAGLUTIDE-WEIGHT MANAGEMENT 0.25 MG/0.5ML ~~LOC~~ SOAJ: 0.2500 mg | SUBCUTANEOUS | 0 refills | Status: AC

## 2022-06-19 MED ORDER — SEMAGLUTIDE-WEIGHT MANAGEMENT 1.7 MG/0.75ML ~~LOC~~ SOAJ: 1.7000 mg | SUBCUTANEOUS | 0 refills | Status: DC

## 2022-06-19 MED ORDER — LUPRON DEPOT (3-MONTH) 11.25 MG IM KIT: 11.2500 mg | PACK | INTRAMUSCULAR | 2 refills | Status: DC

## 2022-06-19 MED ORDER — SEMAGLUTIDE-WEIGHT MANAGEMENT 1 MG/0.5ML ~~LOC~~ SOAJ: 1.0000 mg | SUBCUTANEOUS | 0 refills | Status: DC

## 2022-06-19 MED ORDER — SEMAGLUTIDE-WEIGHT MANAGEMENT 2.4 MG/0.75ML ~~LOC~~ SOAJ: 2.4000 mg | SUBCUTANEOUS | 6 refills | Status: DC

## 2022-06-27 ENCOUNTER — Telehealth: Payer: Self-pay

## 2022-06-27 NOTE — Telephone Encounter (Signed)
This pt has questions about the medicine and her injection and how she needs to store it until her appt can you call her and go over it with her and she stated that the pharmacy told her to reach out to [place]

## 2022-06-28 NOTE — Telephone Encounter (Signed)
Patient called. Informed to keep medication at room temperature. She verbalized understanding.

## 2022-07-09 ENCOUNTER — Encounter: Payer: Self-pay | Admitting: Internal Medicine

## 2022-07-09 ENCOUNTER — Ambulatory Visit (INDEPENDENT_AMBULATORY_CARE_PROVIDER_SITE_OTHER): Payer: 59 | Admitting: Internal Medicine

## 2022-07-09 VITALS — BP 128/60 | HR 94 | Temp 98.1°F | Ht 63.0 in | Wt 288.6 lb

## 2022-07-09 DIAGNOSIS — E559 Vitamin D deficiency, unspecified: Secondary | ICD-10-CM | POA: Diagnosis not present

## 2022-07-09 DIAGNOSIS — E611 Iron deficiency: Secondary | ICD-10-CM | POA: Diagnosis not present

## 2022-07-09 DIAGNOSIS — E785 Hyperlipidemia, unspecified: Secondary | ICD-10-CM

## 2022-07-09 DIAGNOSIS — R5383 Other fatigue: Secondary | ICD-10-CM | POA: Diagnosis not present

## 2022-07-09 DIAGNOSIS — Z1231 Encounter for screening mammogram for malignant neoplasm of breast: Secondary | ICD-10-CM

## 2022-07-09 DIAGNOSIS — Z6841 Body Mass Index (BMI) 40.0 and over, adult: Secondary | ICD-10-CM

## 2022-07-09 LAB — IBC + FERRITIN
Ferritin: 14.8 ng/mL (ref 10.0–291.0)
Iron: 92 ug/dL (ref 42–145)
Saturation Ratios: 25.4 % (ref 20.0–50.0)
TIBC: 362.6 ug/dL (ref 250.0–450.0)
Transferrin: 259 mg/dL (ref 212.0–360.0)

## 2022-07-09 LAB — LIPID PANEL
Cholesterol: 179 mg/dL (ref 0–200)
HDL: 33.7 mg/dL — ABNORMAL LOW (ref >39.00)
LDL Cholesterol: 121 mg/dL — ABNORMAL HIGH (ref 0–99)
NonHDL: 145.04
Total CHOL/HDL Ratio: 5
Triglycerides: 122 mg/dL (ref 0.0–149.0)
VLDL: 24.4 mg/dL (ref 0.0–40.0)

## 2022-07-09 LAB — CBC WITH DIFFERENTIAL/PLATELET
Basophils Absolute: 0 10*3/uL (ref 0.0–0.1)
Basophils Relative: 0.6 % (ref 0.0–3.0)
Eosinophils Absolute: 0.2 10*3/uL (ref 0.0–0.7)
Eosinophils Relative: 4 % (ref 0.0–5.0)
HCT: 40.2 % (ref 36.0–46.0)
Hemoglobin: 13.1 g/dL (ref 12.0–15.0)
Lymphocytes Relative: 10.3 % — ABNORMAL LOW (ref 12.0–46.0)
Lymphs Abs: 0.6 10*3/uL — ABNORMAL LOW (ref 0.7–4.0)
MCHC: 32.5 g/dL (ref 30.0–36.0)
MCV: 89.3 fl (ref 78.0–100.0)
Monocytes Absolute: 0.4 10*3/uL (ref 0.1–1.0)
Monocytes Relative: 6.4 % (ref 3.0–12.0)
Neutro Abs: 4.8 10*3/uL (ref 1.4–7.7)
Neutrophils Relative %: 78.7 % — ABNORMAL HIGH (ref 43.0–77.0)
Platelets: 315 10*3/uL (ref 150.0–400.0)
RBC: 4.5 Mil/uL (ref 3.87–5.11)
RDW: 14.3 % (ref 11.5–15.5)
WBC: 6.1 10*3/uL (ref 4.0–10.5)

## 2022-07-09 LAB — COMPREHENSIVE METABOLIC PANEL
ALT: 22 U/L (ref 0–35)
AST: 13 U/L (ref 0–37)
Albumin: 4 g/dL (ref 3.5–5.2)
Alkaline Phosphatase: 76 U/L (ref 39–117)
BUN: 12 mg/dL (ref 6–23)
CO2: 27 mEq/L (ref 19–32)
Calcium: 9 mg/dL (ref 8.4–10.5)
Chloride: 103 mEq/L (ref 96–112)
Creatinine, Ser: 0.92 mg/dL (ref 0.40–1.20)
GFR: 73.63 mL/min (ref >60.00)
Glucose, Bld: 106 mg/dL — ABNORMAL HIGH (ref 70–99)
Potassium: 4.5 mEq/L (ref 3.5–5.1)
Sodium: 137 mEq/L (ref 135–145)
Total Bilirubin: 0.6 mg/dL (ref 0.2–1.2)
Total Protein: 6.9 g/dL (ref 6.0–8.3)

## 2022-07-09 LAB — VITAMIN D 25 HYDROXY (VIT D DEFICIENCY, FRACTURES): VITD: 99.54 ng/mL (ref 30.00–100.00)

## 2022-07-09 NOTE — Patient Instructions (Addendum)
Dr. Briscoe Deutscher [facility] [place], [place] [address] Call us [phone] Our Hours Monday-Thursday 7:00 am - 5:00 pm Friday: Closed  Debrox ear wax cleaning drops 5-10 drops each ear place cotton ball to keep it in x 7 days then day 8 warm water and hydrogen peroxide (with ear flush device)    Cholesterol Content in Foods Cholesterol is a waxy, fat-like substance that helps to carry fat in the blood. The body needs cholesterol in small amounts, but too much cholesterol can cause damage to the arteries and heart. What foods have cholesterol?  Cholesterol is found in animal-based foods, such as meat, seafood, and dairy. Generally, low-fat dairy and lean meats have less cholesterol than full-fat dairy and fatty meats. The milligrams of cholesterol per serving (mg per serving) of common cholesterol-containing foods are listed below. Meats and other proteins Egg -- one large whole egg has 186 mg. Veal shank -- 4 oz (113 g) has 141 mg. Lean ground [place] (93% lean) -- 4 oz (113 g) has 118 mg. Fat-trimmed lamb loin -- 4 oz (113 g) has 106 mg. Lean ground beef (90% lean) -- 4 oz (113 g) has 100 mg. Lobster -- 3.5 oz (99 g) has 90 mg. Pork loin chops -- 4 oz (113 g) has 86 mg. Canned salmon -- 3.5 oz (99 g) has 83 mg. Fat-trimmed beef top loin -- 4 oz (113 g) has 78 mg. Frankfurter -- 1 frank (3.5 oz or 99 g) has 77 mg. Crab -- 3.5 oz (99 g) has 71 mg. Roasted chicken without skin, white meat -- 4 oz (113 g) has 66 mg. Light bologna -- 2 oz (57 g) has 45 mg. Deli-cut [place] -- 2 oz (57 g) has 31 mg. Canned tuna -- 3.5 oz (99 g) has 31 mg. Berniece Salines -- 1 oz (28 g) has 29 mg. Oysters and mussels (raw) -- 3.5 oz (99 g) has 25 mg. Mackerel -- 1 oz (28 g) has 22 mg. Trout -- 1 oz (28 g) has 20 mg. Pork sausage -- 1 link (1 oz or 28 g) has 17 mg. Salmon -- 1 oz (28 g) has 16 mg. Tilapia -- 1 oz (28 g) has 14 mg. Dairy Soft-serve ice cream --  cup (4 oz or 86 g) has  103 mg. Whole-milk yogurt -- 1 cup (8 oz or 245 g) has 29 mg. Cheddar cheese -- 1 oz (28 g) has 28 mg. American cheese -- 1 oz (28 g) has 28 mg. Whole milk -- 1 cup (8 oz or 250 mL) has 23 mg. 2% milk -- 1 cup (8 oz or 250 mL) has 18 mg. Cream cheese -- 1 tablespoon (Tbsp) (14.5 g) has 15 mg. Cottage cheese --  cup (4 oz or 113 g) has 14 mg. Low-fat (1%) milk -- 1 cup (8 oz or 250 mL) has 10 mg. Sour cream -- 1 Tbsp (12 g) has 8.5 mg. Low-fat yogurt -- 1 cup (8 oz or 245 g) has 8 mg. Nonfat Greek yogurt -- 1 cup (8 oz or 228 g) has 7 mg. Half-and-half cream -- 1 Tbsp (15 mL) has 5 mg. Fats and oils Cod liver oil -- 1 tablespoon (Tbsp) (13.6 g) has 82 mg. Butter -- 1 Tbsp (14 g) has 15 mg. Lard -- 1 Tbsp (12.8 g) has 14 mg. Bacon grease -- 1 Tbsp (12.9 g) has 14 mg. Mayonnaise -- 1 Tbsp (13.8 g) has 5-10 mg. Margarine -- 1 Tbsp (14 g)  has 3-10 mg. The items listed above may not be a complete list of foods with cholesterol. Exact amounts of cholesterol in these foods may vary depending on specific ingredients and brands. Contact a dietitian for more information. What foods do not have cholesterol? Most plant-based foods do not have cholesterol unless you combine them with a food that has cholesterol. Foods without cholesterol include: Grains and cereals. Vegetables. Fruits. Vegetable oils, such as olive, canola, and sunflower oil. Legumes, such as peas, beans, and lentils. Nuts and seeds. Egg whites. The items listed above may not be a complete list of foods that do not have cholesterol. Contact a dietitian for more information. Summary The body needs cholesterol in small amounts, but too much cholesterol can cause damage to the arteries and heart. Cholesterol is found in animal-based foods, such as meat, seafood, and dairy. Generally, low-fat dairy and lean meats have less cholesterol than full-fat dairy and fatty meats. This information is not intended to replace advice given to  you by your health care provider. Make sure you discuss any questions you have with your health care provider. Document Revised: 02/09/2021 Document Reviewed: 02/09/2021 Elsevier Patient Education  [place].  High Cholesterol  High cholesterol is a condition in which the blood has high levels of a white, waxy substance similar to fat (cholesterol). The liver makes all the cholesterol that the body needs. The human body needs small amounts of cholesterol to help build cells. A person gets extra or excess cholesterol from the food that he or she eats. The blood carries cholesterol from the liver to the rest of the body. If you have high cholesterol, deposits (plaques) may build up on the walls of your arteries. Arteries are the blood vessels that carry blood away from your heart. These plaques make the arteries narrow and stiff. Cholesterol plaques increase your risk for heart attack and stroke. Work with your health care provider to keep your cholesterol levels in a healthy range. What increases the risk? The following factors may make you more likely to develop this condition: Eating foods that are high in animal fat (saturated fat) or cholesterol. Being overweight. Not getting enough exercise. A family history of high cholesterol (familial hypercholesterolemia). Use of tobacco products. Having diabetes. What are the signs or symptoms? In most cases, high cholesterol does not usually cause any symptoms. In severe cases, very high cholesterol levels can cause: Fatty bumps under the skin (xanthomas). A white or gray ring around the black center (pupil) of the eye. How is this diagnosed? This condition may be diagnosed based on the results of a blood test. If you are older than 48 years of age, your health care provider may check your cholesterol levels every 4-6 years. You may be checked more often if you have high cholesterol or other risk factors for heart disease. The blood test  for cholesterol measures: "Bad" cholesterol, or LDL cholesterol. This is the main type of cholesterol that causes heart disease. The desired level is less than 100 mg/dL (2.59 mmol/L). "Good" cholesterol, or HDL cholesterol. HDL helps protect against heart disease by cleaning the arteries and carrying the LDL to the liver for processing. The desired level for HDL is 60 mg/dL (1.55 mmol/L) or higher. Triglycerides. These are fats that your body can store or burn for energy. The desired level is less than 150 mg/dL (1.69 mmol/L). Total cholesterol. This measures the total amount of cholesterol in your blood and includes LDL, HDL, and triglycerides. The  desired level is less than 200 mg/dL (5.17 mmol/L). How is this treated? Treatment for high cholesterol starts with lifestyle changes, such as diet and exercise. Diet changes. You may be asked to eat foods that have more fiber and less saturated fats or added sugar. Lifestyle changes. These may include regular exercise, maintaining a healthy weight, and quitting use of tobacco products. Medicines. These are given when diet and lifestyle changes have not worked. You may be prescribed a statin medicine to help lower your cholesterol levels. Follow these instructions at home: Eating and drinking  Eat a healthy, balanced diet. This diet includes: Daily servings of a variety of fresh, frozen, or canned fruits and vegetables. Daily servings of whole grain foods that are rich in fiber. Foods that are low in saturated fats and trans fats. These include poultry and fish without skin, lean cuts of meat, and low-fat dairy products. A variety of fish, especially oily fish that contain omega-3 fatty acids. Aim to eat fish at least 2 times a week. Avoid foods and drinks that have added sugar. Use healthy cooking methods, such as roasting, grilling, broiling, baking, poaching, steaming, and stir-frying. Do not fry your food except for stir-frying. If you drink  alcohol: Limit how much you have to: 0-1 drink a day for women who are not pregnant. 0-2 drinks a day for men. Know how much alcohol is in a drink. In the [place] one drink equals one 12 oz bottle of beer (355 mL), one 5 oz glass of wine (148 mL), or one 1 oz glass of hard liquor (44 mL). Lifestyle  Get regular exercise. Aim to exercise for a total of 150 minutes a week. Increase your activity level by doing activities such as gardening, walking, and taking the stairs. Do not use any products that contain nicotine or tobacco. These products include cigarettes, chewing tobacco, and vaping devices, such as e-cigarettes. If you need help quitting, ask your health care provider. General instructions Take over-the-counter and prescription medicines only as told by your health care provider. Keep all follow-up visits. This is important. Where to find more information American Heart Association: www.heart.org National Heart, Lung, and Blood Institute: https://wilson-eaton.com/ Contact a health care provider if: You have trouble achieving or maintaining a healthy diet or weight. You are starting an exercise program. You are unable to stop smoking. Get help right away if: You have chest pain. You have trouble breathing. You have discomfort or pain in your jaw, neck, back, shoulder, or arm. You have any symptoms of a stroke. "BE FAST" is an easy way to remember the main warning signs of a stroke: B - Balance. Signs are dizziness, sudden trouble walking, or loss of balance. E - Eyes. Signs are trouble seeing or a sudden change in vision. F - Face. Signs are sudden weakness or numbness of the face, or the face or eyelid drooping on one side. A - Arms. Signs are weakness or numbness in an arm. This happens suddenly and usually on one side of the body. S - Speech. Signs are sudden trouble speaking, slurred speech, or trouble understanding what people say. T - Time. Time to call emergency services. Write down  what time symptoms started. You have other signs of a stroke, such as: A sudden, severe headache with no known cause. Nausea or vomiting. Seizure. These symptoms may represent a serious problem that is an emergency. Do not wait to see if the symptoms will go away. Get medical help right away. Call  your local emergency services (911 in the [place] Do not drive yourself to the hospital. Summary Cholesterol plaques increase your risk for heart attack and stroke. Work with your health care provider to keep your cholesterol levels in a healthy range. Eat a healthy, balanced diet, get regular exercise, and maintain a healthy weight. Do not use any products that contain nicotine or tobacco. These products include cigarettes, chewing tobacco, and vaping devices, such as e-cigarettes. Get help right away if you have any symptoms of a stroke. This information is not intended to replace advice given to you by your health care provider. Make sure you discuss any questions you have with your health care provider. Document Revised: 12/14/2020 Document Reviewed: 12/04/2020 Elsevier Patient Education  [place].

## 2022-07-09 NOTE — Progress Notes (Signed)
Chief Complaint  Patient presents with   Follow-up    3 month f/u   F/u  1. Hld check labs today, obesity bmi >50 wants referral wt loss clinic  2. Fibroids pap appt with Dr. Marcelline Mates to show her how to use lupron-depot shot will recheck fibroids in 3 months to decide if fibroid removal rec and ventral hernia repair     Review of Systems  Constitutional:  Negative for weight loss.  HENT:  Negative for hearing loss.   Eyes:  Negative for blurred vision.  Respiratory:  Negative for shortness of breath.   Cardiovascular:  Negative for chest pain.  Gastrointestinal:  Negative for abdominal pain and blood in stool.  Genitourinary:  Negative for dysuria.  Musculoskeletal:  Negative for falls and joint pain.  Skin:  Negative for rash.  Neurological:  Negative for headaches.  Psychiatric/Behavioral:  Negative for depression.    Past Medical History:  Diagnosis Date   Anemia of unknown etiology 02/28/2016   Chicken pox    Depression    Frequent headaches    Glaucoma    Multiple sclerosis ([place])    Sleep apnea    Urine incontinence    Past Surgical History:  Procedure Laterality Date   COLONOSCOPY WITH PROPOFOL N/A 11/01/2021   Procedure: COLONOSCOPY WITH PROPOFOL;  Surgeon: Jonathon Bellows, MD;  [facility] ENDOSCOPY;  Service: Gastroenterology;  Laterality: N/A;   ESOPHAGOGASTRODUODENOSCOPY N/A 11/01/2021   Procedure: ESOPHAGOGASTRODUODENOSCOPY (EGD);  Surgeon: Jonathon Bellows, MD;  Location: [facility] ENDOSCOPY;  Service: Gastroenterology;  Laterality: N/A;   UTERINE FIBROID SURGERY     Family History  Problem Relation Age of Onset   Depression Mother    Diabetes Mother    Hyperlipidemia Mother    Hypertension Mother    Uterine cancer Mother    Diabetes Sister    Hypertension Sister    Breast cancer Neg Hx    Social History   Socioeconomic History   Marital status: Single    Spouse name: Not on file   Number of children: Not on file   Years of education: Not on file   Highest  education level: Not on file  Occupational History   Not on file  Tobacco Use   Smoking status: Never   Smokeless tobacco: Never  Vaping Use   Vaping Use: Never used  Substance and Sexual Activity   Alcohol use: Never   Drug use: Never   Sexual activity: Not Currently  Other Topics Concern   Not on file  Social History Narrative   Not on file   Social Determinants of Health   Financial Resource Strain: Low Risk  (02/26/2022)   Overall Financial Resource Strain (CARDIA)    Difficulty of Paying Living Expenses: Not hard at all  Food Insecurity: No Food Insecurity (02/26/2022)   Hunger Vital Sign    Worried About Running Out of Food in the Last Year: Never true    Ran Out of Food in the Last Year: Never true  Transportation Needs: No Transportation Needs (02/26/2022)   PRAPARE - Hydrologist (Medical): No    Lack of Transportation (Non-Medical): No  Physical Activity: Unknown (02/26/2022)   Exercise Vital Sign    Days of Exercise per Week: 4 days    Minutes of Exercise per Session: Not on file  Stress: No Stress Concern Present (02/26/2022)   [place]    Feeling of Stress : Not at  all  Social Connections: Unknown (02/26/2022)   Social Connection and Isolation Panel [NHANES]    Frequency of Communication with Friends and Family: Not on file    Frequency of Social Gatherings with Friends and Family: More than three times a week    Attends Religious Services: Not on file    Active Member of Clubs or Organizations: Not on file    Attends Archivist Meetings: Not on file    Marital Status: Not on file  Intimate Partner Violence: Not At Risk (02/26/2022)   Humiliation, Afraid, Rape, and Kick questionnaire    Fear of Current or Ex-Partner: No    Emotionally Abused: No    Physically Abused: No    Sexually Abused: No   Current Meds  Medication Sig   baclofen (LIORESAL) 10 MG  tablet Take 10 mg by mouth 3 (three) times daily as needed.   Cholecalciferol (VITAMIN D3) 1.25 MG (50000 UT) CAPS Take 1 capsule by mouth once a week   CRYSELLE-28 0.3-30 MG-MCG tablet Take 1 tablet by mouth daily. Use as directed   FEROSUL 325 (65 Fe) MG tablet Take 325 mg by mouth daily.   gabapentin (NEURONTIN) 100 MG capsule 200 mg 2 (two) times daily. But taking M, W, F   GILENYA 0.5 MG CAPS Take 1 capsule by mouth daily.   leuprolide (LUPRON DEPOT, 60-MONTH,) 11.25 MG injection Inject 11.25 mg into the muscle every 3 (three) months.   LUMIGAN 0.01 % SOLN 1 drop at bedtime.   phentermine 37.5 MG capsule Take 37.5 mg by mouth every morning.   pravastatin (PRAVACHOL) 20 MG tablet Take 1 tablet (20 mg total) by mouth daily. After 6 pm   vitamin B-12 (CYANOCOBALAMIN) 1000 MCG tablet Take 1 tablet (1,000 mcg total) by mouth daily.   No Known Allergies No results found for this or any previous visit (from the past 2160 hour(s)). Objective  Body mass index is 51.12 kg/m. Wt Readings from Last 3 Encounters:  07/09/22 288 lb 9.6 oz (130.9 kg)  06/19/22 286 lb (129.7 kg)  04/18/22 289 lb 1.6 oz (131.1 kg)   Temp Readings from Last 3 Encounters:  07/09/22 98.1 F (36.7 C) (Oral)  03/21/22 98 F (36.7 C) (Oral)  01/09/22 97.6 F (36.4 C) (Oral)   BP Readings from Last 3 Encounters:  07/09/22 128/60  06/19/22 (!) 142/95  04/18/22 132/81   Pulse Readings from Last 3 Encounters:  07/09/22 94  06/19/22 89  04/18/22 92    Physical Exam Vitals and nursing note reviewed.  Constitutional:      Appearance: Normal appearance. She is well-developed and well-groomed.  HENT:     Head: Normocephalic and atraumatic.  Eyes:     Conjunctiva/sclera: Conjunctivae normal.     Pupils: Pupils are equal, round, and reactive to light.  Cardiovascular:     Rate and Rhythm: Normal rate and regular rhythm.     Heart sounds: Normal heart sounds. No murmur heard. Pulmonary:     Effort: Pulmonary  effort is normal.     Breath sounds: Normal breath sounds.  Abdominal:     General: Abdomen is flat. Bowel sounds are normal.     Tenderness: There is no abdominal tenderness.  Musculoskeletal:        General: No tenderness.  Skin:    General: Skin is warm and dry.  Neurological:     General: No focal deficit present.     Mental Status: She is alert and oriented to  person, place, and time. Mental status is at baseline.     Cranial Nerves: Cranial nerves 2-12 are intact.     Motor: Motor function is intact.     Coordination: Coordination is intact.     Gait: Gait is intact.  Psychiatric:        Attention and Perception: Attention and perception normal.        Mood and Affect: Mood and affect normal.        Speech: Speech normal.        Behavior: Behavior normal. Behavior is cooperative.        Thought Content: Thought content normal.        Cognition and Memory: Cognition and memory normal.        Judgment: Judgment normal.     Assessment  Plan  Hyperlipidemia, unspecified hyperlipidemia type - Plan: Lipid Profile, Amb Ref to Medical Weight Management   Class 3 severe obesity due to excess calories with serious comorbidity and body mass index (BMI) of 50.0 to 59.9 in adult [facility]) - Plan: Amb Ref to Medical Weight Management Dr .Briscoe Deutscher  HM Flu utd  3/3 covid shots  Tdap declines 12/19/21    Mammogram 02/28/21 negative ordered pt has to schedule 04/15/22 negative ordered 04/2023  Declines hep C/HIV   Colonoscopy 11/01/21 repeat in 7 years Dr. Vicente Males  Pap has all female parts   Pap 03/21/22 neg pap neg HPV Dr. Marcelline Mates is ob/gyn   Rec healthy diet and exercise      Ventral hernia without obstruction or gangrene  Consider surgical consult in the future   Fibroids pending myomectomy with Dr. Marcelline Mates  Provider: Dr. Olivia Mackie McLean-Scocuzza-Internal Medicine

## 2022-07-15 NOTE — Progress Notes (Addendum)
    GYNECOLOGY PROGRESS NOTE  Subjective:    Patient ID: Shelia Chapman, female    DOB: 26-Dec-1975, 46 y.o.   MRN: [id number]  HPI  Patient is a 46 y.o. G0P0000 female who presents for her first Lupron Depo Injection. Patient notes that since she is no longer having major symptoms for her fibroids she is hoping to consider use of Depot-Lupron to help shrink her fibroids prior to surgical intervention (hysterectomy and ventral hernia repair).  Denies any major complaints today.  Additionally was supposed to be following up on weight loss after initiation of Wegovy, however patient notes that she did not know that the medication had been called in and she never received any notification from her pharmacy.  She has not started this medication at this time.  Has still only been taking the phentermine prescribed by her PCP.  The following portions of the patient's history were reviewed and updated as appropriate: allergies, current medications, past family history, past medical history, past social history, past surgical history, and problem list.  Review of Systems Pertinent items noted in HPI and remainder of comprehensive ROS otherwise negative.   Objective:   Blood pressure (!) 133/56, pulse 85, resp. rate 16, height '5\' 3"'$  (1.6 m), weight 290 lb 12.8 oz (131.9 kg), last menstrual period 07/09/2022.  Body mass index is 51.51 kg/m.  Wt Readings from Last 3 Encounters:  07/16/22 290 lb 12.8 oz (131.9 kg)  07/09/22 288 lb 9.6 oz (130.9 kg)  06/19/22 286 lb (129.7 kg)    General appearance: alert, cooperative, and no distress Remainder of exam deferred today  Assessment:   1. Uterine leiomyoma, unspecified location   2. Ventral hernia without obstruction or gangrene   3. History of anemia   4. Morbid obesity with BMI of 50.0-59.9, adult ([place])      Plan:   1.  Initial dose of Lupron 11.25 mg given today.  Discussed potential side effects of medication, more specifically vasomotor  symptoms.  I advised that if symptoms become significant, can consider add back therapy with progesterone. 2.  Advised patient to follow-up with her pharmacy to initiate weight loss medication of Wegovy.  Has been on phentermine however has not noticed any significant weight changes on the medication after the first month.  Advised that if she is able to pick up the [facility] that she can discontinue use of the phentermine at that time. 3.   Patient to follow-up in 3 months for next Lupron injection and to reassess weight loss with initiation of Wegovy.  Rubie Maid, MD [facility]

## 2022-07-16 ENCOUNTER — Encounter: Payer: Self-pay | Admitting: Obstetrics and Gynecology

## 2022-07-16 ENCOUNTER — Ambulatory Visit (INDEPENDENT_AMBULATORY_CARE_PROVIDER_SITE_OTHER): Payer: 59 | Admitting: Obstetrics and Gynecology

## 2022-07-16 VITALS — BP 133/56 | HR 85 | Resp 16 | Ht 63.0 in | Wt 290.8 lb

## 2022-07-16 DIAGNOSIS — K439 Ventral hernia without obstruction or gangrene: Secondary | ICD-10-CM | POA: Diagnosis not present

## 2022-07-16 DIAGNOSIS — Z6841 Body Mass Index (BMI) 40.0 and over, adult: Secondary | ICD-10-CM

## 2022-07-16 DIAGNOSIS — Z862 Personal history of diseases of the blood and blood-forming organs and certain disorders involving the immune mechanism: Secondary | ICD-10-CM | POA: Diagnosis not present

## 2022-07-16 DIAGNOSIS — D259 Leiomyoma of uterus, unspecified: Secondary | ICD-10-CM

## 2022-07-16 MED ORDER — LEUPROLIDE ACETATE (3 MONTH) 11.25 MG IM KIT
11.2500 mg | PACK | INTRAMUSCULAR | Status: DC
  Administered 2022-07-16: 11.25 mg via INTRAMUSCULAR

## 2022-07-17 ENCOUNTER — Telehealth: Payer: Self-pay

## 2022-07-18 NOTE — Telephone Encounter (Signed)
Pt called back and wants to let Dr. Marcelline Mates know she has called around to pharmacies and no one has the weight loss med.

## 2022-07-18 NOTE — Telephone Encounter (Signed)
Ok thank you.  Can you check with [facility] pharmacy as well.  I forget that we can send patients there as well, and I know some have had success there. I will also look into some other options for her if the medication will not be available anytime soon.

## 2022-07-22 ENCOUNTER — Inpatient Hospital Stay: Payer: 59 | Attending: Oncology

## 2022-07-22 DIAGNOSIS — D72810 Lymphocytopenia: Secondary | ICD-10-CM | POA: Insufficient documentation

## 2022-07-22 DIAGNOSIS — D649 Anemia, unspecified: Secondary | ICD-10-CM

## 2022-07-22 DIAGNOSIS — E538 Deficiency of other specified B group vitamins: Secondary | ICD-10-CM | POA: Insufficient documentation

## 2022-07-22 DIAGNOSIS — Z8049 Family history of malignant neoplasm of other genital organs: Secondary | ICD-10-CM | POA: Insufficient documentation

## 2022-07-22 DIAGNOSIS — D509 Iron deficiency anemia, unspecified: Secondary | ICD-10-CM | POA: Insufficient documentation

## 2022-07-22 DIAGNOSIS — G35 Multiple sclerosis: Secondary | ICD-10-CM | POA: Insufficient documentation

## 2022-07-22 LAB — CBC WITH DIFFERENTIAL/PLATELET
Abs Immature Granulocytes: 0.03 10*3/uL (ref 0.00–0.07)
Basophils Absolute: 0 10*3/uL (ref 0.0–0.1)
Basophils Relative: 1 %
Eosinophils Absolute: 0.1 10*3/uL (ref 0.0–0.5)
Eosinophils Relative: 1 %
HCT: 40 % (ref 36.0–46.0)
Hemoglobin: 13 g/dL (ref 12.0–15.0)
Immature Granulocytes: 1 %
Lymphocytes Relative: 12 %
Lymphs Abs: 0.8 10*3/uL (ref 0.7–4.0)
MCH: 28.7 pg (ref 26.0–34.0)
MCHC: 32.5 g/dL (ref 30.0–36.0)
MCV: 88.3 fL (ref 80.0–100.0)
Monocytes Absolute: 0.5 10*3/uL (ref 0.1–1.0)
Monocytes Relative: 8 %
Neutro Abs: 4.7 10*3/uL (ref 1.7–7.7)
Neutrophils Relative %: 77 %
Platelets: 355 10*3/uL (ref 150–400)
RBC: 4.53 MIL/uL (ref 3.87–5.11)
RDW: 14 % (ref 11.5–15.5)
WBC: 6 10*3/uL (ref 4.0–10.5)
nRBC: 0 % (ref 0.0–0.2)

## 2022-07-22 LAB — FERRITIN: Ferritin: 13 ng/mL (ref 11–307)

## 2022-07-22 LAB — COMPREHENSIVE METABOLIC PANEL
ALT: 15 U/L (ref 0–44)
AST: 17 U/L (ref 15–41)
Albumin: 3.6 g/dL (ref 3.5–5.0)
Alkaline Phosphatase: 64 U/L (ref 38–126)
Anion gap: 5 (ref 5–15)
BUN: 12 mg/dL (ref 6–20)
CO2: 26 mmol/L (ref 22–32)
Calcium: 8.7 mg/dL — ABNORMAL LOW (ref 8.9–10.3)
Chloride: 106 mmol/L (ref 98–111)
Creatinine, Ser: 0.82 mg/dL (ref 0.44–1.00)
GFR, Estimated: >60 mL/min (ref >60)
Glucose, Bld: 118 mg/dL — ABNORMAL HIGH (ref 70–99)
Potassium: 4.2 mmol/L (ref 3.5–5.1)
Sodium: 137 mmol/L (ref 135–145)
Total Bilirubin: 0.5 mg/dL (ref 0.3–1.2)
Total Protein: 7.2 g/dL (ref 6.5–8.1)

## 2022-07-22 LAB — IRON AND TIBC
Iron: 82 ug/dL (ref 28–170)
Saturation Ratios: 21 % (ref 10.4–31.8)
TIBC: 399 ug/dL (ref 250–450)
UIBC: 317 ug/dL

## 2022-07-22 LAB — VITAMIN B12: Vitamin B-12: 732 pg/mL (ref 180–914)

## 2022-07-24 ENCOUNTER — Encounter: Payer: Self-pay | Admitting: Oncology

## 2022-07-24 ENCOUNTER — Inpatient Hospital Stay (HOSPITAL_BASED_OUTPATIENT_CLINIC_OR_DEPARTMENT_OTHER): Payer: 59 | Admitting: Oncology

## 2022-07-24 VITALS — BP 129/67 | HR 88 | Temp 98.2°F | Resp 18 | Wt 290.8 lb

## 2022-07-24 DIAGNOSIS — D72810 Lymphocytopenia: Secondary | ICD-10-CM | POA: Insufficient documentation

## 2022-07-24 DIAGNOSIS — Z8639 Personal history of other endocrine, nutritional and metabolic disease: Secondary | ICD-10-CM | POA: Diagnosis not present

## 2022-07-24 DIAGNOSIS — E538 Deficiency of other specified B group vitamins: Secondary | ICD-10-CM | POA: Diagnosis not present

## 2022-07-24 DIAGNOSIS — D509 Iron deficiency anemia, unspecified: Secondary | ICD-10-CM | POA: Diagnosis not present

## 2022-07-24 NOTE — Assessment & Plan Note (Signed)
ron panel result was available after her visit.  Ferritin is wnl, recommend patient to continue oral ferrous sulfate '325mg'$  every other day for maintenance.

## 2022-07-24 NOTE — Assessment & Plan Note (Signed)
recommend patient conitnue  B12 1089mg daily

## 2022-07-24 NOTE — Progress Notes (Signed)
Hematology/Oncology Progress note [phone] [phone]      Patient Care Team: McLean-Scocuzza, Nino Glow, MD as PCP - General (Internal Medicine)  ASSESSMENT & PLAN:   B12 deficiency recommend patient conitnue  B12 1029mg daily   History of iron deficiency ron panel result was available after her visit.  Ferritin is wnl, recommend patient to continue oral ferrous sulfate '325mg'$  every other day for maintenance.   No orders of the defined types were placed in this encounter.  Patient opted to be discharged today and continue follow up with primary care physician.   ZEarlie Server MD, PhD [facility] Oncology 07/24/2022    CHIEF COMPLAINTS/REASON FOR VISIT:  Iron deficiency anemia, lymphocytopenia, B12 deficiency  HISTORY OF PRESENTING ILLNESS:  Shelia Chapman a  46y.o.  female with PMH listed below who was referred to me for evaluation of anemia Reviewed patient's recent labs that was done.   Reviewed patient's previous labs ordered by primary care physician's office, patient reports a history of anemia, previously seen by hematologist and was recommended to take iron supplementation.  [place] and wants to establish care locally. She had blood work done on 02/15/2021.  Hemoglobin 12.8, normal total white blood cell count with slightly increased neutrophil.  Low lymphocyte 0.6. 02/15/2021 ferritin 15, iron saturation 19, TIBC 365. Patient reports mild chronic fatigue.  Otherwise doing well.  Denies any black or bloody bowel movement. She tolerates iron supplementation except mild constipation.   INTERVAL HISTORY Shelia HBlubaughis a 46y.o. female who has above history reviewed by me today presents for follow up visit for management of Iron deficiency anemia, lymphocytopenia, B12 deficiency She takes oral vitamin b12 supplementation and oral ferrous sulfate supplementation once daily.  She has no new  complaints.    Review of Systems  Constitutional:  Negative for appetite change, chills, fatigue and fever.  HENT:   Negative for hearing loss and voice change.   Eyes:  Negative for eye problems.  Respiratory:  Negative for chest tightness and cough.   Cardiovascular:  Negative for chest pain.  Gastrointestinal:  Negative for abdominal distention, abdominal pain and blood in stool.  Endocrine: Negative for hot flashes.  Genitourinary:  Negative for difficulty urinating and frequency.   Musculoskeletal:  Negative for arthralgias.  Skin:  Negative for itching and rash.  Neurological:  Negative for extremity weakness.  Hematological:  Negative for adenopathy.  Psychiatric/Behavioral:  Negative for confusion.      MEDICAL HISTORY:  Past Medical History:  Diagnosis Date   Anemia of unknown etiology 02/28/2016   Chicken pox    Depression    Frequent headaches    Glaucoma    [place]    Sleep apnea    Urine incontinence     SURGICAL HISTORY: Past Surgical History:  Procedure Laterality Date   COLONOSCOPY WITH PROPOFOL N/A 11/01/2021   Procedure: COLONOSCOPY WITH PROPOFOL;  Surgeon: AJonathon Bellows MD;  [facility];  Service: Gastroenterology;  Laterality: N/A;   ESOPHAGOGASTRODUODENOSCOPY N/A 11/01/2021   Procedure: ESOPHAGOGASTRODUODENOSCOPY (EGD);  Surgeon: AJonathon Bellows MD;  [facility];  Service: Gastroenterology;  Laterality: N/A;   UTERINE FIBROID SURGERY      SOCIAL HISTORY: Social History   Socioeconomic History   Marital status: Single    Spouse name: Not on file   Number of children: Not on file   Years of education: Not on file   Highest education  level: Not on file  Occupational History   Not on file  Tobacco Use   Smoking status: Never   Smokeless tobacco: Never  Vaping Use   Vaping Use: Never used  Substance and Sexual Activity   Alcohol use: Never   Drug use: Never   Sexual activity: Not Currently  Other  Topics Concern   Not on file  Social History Narrative   Not on file   Social Determinants of Health   Financial Resource Strain: Low Risk  (02/26/2022)   Overall Financial Resource Strain (CARDIA)    Difficulty of Paying Living Expenses: Not hard at all  Food Insecurity: No Food Insecurity (02/26/2022)   Hunger Vital Sign    Worried About Running Out of Food in the Last Year: Never true    [place] in the Last Year: Never true  Transportation Needs: No Transportation Needs (02/26/2022)   PRAPARE - Hydrologist (Medical): No    Lack of Transportation (Non-Medical): No  Physical Activity: Unknown (02/26/2022)   Exercise Vital Sign    Days of Exercise per Week: 4 days    Minutes of Exercise per Session: Not on file  Stress: No Stress Concern Present (02/26/2022)   [place]    Feeling of Stress : Not at all  Social Connections: Unknown (02/26/2022)   Social Connection and Isolation Panel [NHANES]    Frequency of Communication with Friends and Family: Not on file    Frequency of Social Gatherings with Friends and Family: More than three times a week    Attends Religious Services: Not on file    Active Member of Clubs or Organizations: Not on file    Attends Archivist Meetings: Not on file    Marital Status: Not on file  Intimate Partner Violence: Not At Risk (02/26/2022)   Humiliation, Afraid, Rape, and Kick questionnaire    Fear of Current or Ex-Partner: No    Emotionally Abused: No    Physically Abused: No    Sexually Abused: No    FAMILY HISTORY: Family History  Problem Relation Age of Onset   Depression Mother    Diabetes Mother    Hyperlipidemia Mother    Hypertension Mother    Uterine cancer Mother    Diabetes Sister    Hypertension Sister    Breast cancer Neg Hx     ALLERGIES:  has No Known Allergies.  MEDICATIONS:  Current Outpatient Medications   Medication Sig Dispense Refill   baclofen (LIORESAL) 10 MG tablet Take 10 mg by mouth 3 (three) times daily as needed.     Cholecalciferol (VITAMIN D3) 1.25 MG (50000 UT) CAPS Take 1 capsule by mouth once a week 13 capsule 0   CRYSELLE-28 0.3-30 MG-MCG tablet Take 1 tablet by mouth daily. Use as directed 90 tablet 3   FEROSUL 325 (65 Fe) MG tablet Take 325 mg by mouth daily.     gabapentin (NEURONTIN) 100 MG capsule 200 mg 2 (two) times daily. But taking M, W, F     GILENYA 0.5 MG CAPS Take 1 capsule by mouth daily.     leuprolide (LUPRON DEPOT, 76-MONTH,) 11.25 MG injection Inject 11.25 mg into the muscle every 3 (three) months. 1 each 2   LUMIGAN 0.01 % SOLN 1 drop at bedtime.     phentermine 37.5 MG capsule Take 37.5 mg by mouth every morning.     pravastatin (PRAVACHOL)  20 MG tablet Take 1 tablet (20 mg total) by mouth daily. After 6 pm 90 tablet 3   Semaglutide-Weight Management 0.5 MG/0.5ML SOAJ Inject 0.5 mg into the skin once a week for 28 days. 2 mL 0   vitamin B-12 (CYANOCOBALAMIN) 1000 MCG tablet Take 1 tablet (1,000 mcg total) by mouth daily. 90 tablet 1   [START ON 08/16/2022] Semaglutide-Weight Management 1 MG/0.5ML SOAJ Inject 1 mg into the skin once a week for 28 days. (Patient not taking: Reported on 07/24/2022) 2 mL 0   [START ON 09/14/2022] Semaglutide-Weight Management 1.7 MG/0.75ML SOAJ Inject 1.7 mg into the skin once a week for 28 days. (Patient not taking: Reported on 07/24/2022) 3 mL 0   [START ON 10/13/2022] Semaglutide-Weight Management 2.4 MG/0.75ML SOAJ Inject 2.4 mg into the skin once a week for 28 days. (Patient not taking: Reported on 07/24/2022) 3 mL 6   Current Facility-Administered Medications  Medication Dose Route Frequency Provider Last Rate Last Admin   leuprolide (LUPRON) injection 11.25 mg  11.25 mg Intramuscular Q90 days Rubie Maid, MD   11.25 mg at 07/16/22 0957     PHYSICAL EXAMINATION: ECOG PERFORMANCE STATUS: 0 - Asymptomatic Vitals:    07/24/22 1338  BP: 129/67  Pulse: 88  Resp: 18  Temp: 98.2 F (36.8 C)   Filed Weights   07/24/22 1338  Weight: 290 lb 12.8 oz (131.9 kg)    Physical Exam Constitutional:      General: She is not in acute distress. HENT:     Head: Normocephalic and atraumatic.  Eyes:     General: No scleral icterus. Cardiovascular:     Rate and Rhythm: Normal rate and regular rhythm.  Pulmonary:     Effort: Pulmonary effort is normal. No respiratory distress.     Breath sounds: No wheezing.  Abdominal:     General: Bowel sounds are normal. There is no distension.     Palpations: Abdomen is soft.  Musculoskeletal:        General: No deformity. Normal range of motion.     Cervical back: Normal range of motion and neck supple.  Skin:    General: Skin is warm and dry.  Neurological:     Mental Status: She is alert and oriented to person, place, and time. Mental status is at baseline.     Cranial Nerves: No cranial nerve deficit.     Coordination: Coordination normal.  Psychiatric:        Mood and Affect: Mood normal.      LABORATORY DATA:  I have reviewed the data as listed    Latest Ref Rng & Units 07/22/2022    9:38 AM 07/09/2022   10:13 AM 02/15/2022    8:10 AM  CBC  WBC 4.0 - 10.5 K/uL 6.0  6.1  6.2   Hemoglobin 12.0 - 15.0 g/dL 13.0  13.1  13.1   Hematocrit 36.0 - 46.0 % 40.0  40.2  39.7   Platelets 150 - 400 K/uL 355  315.0  312.0       Latest Ref Rng & Units 07/22/2022    9:38 AM 07/09/2022   10:13 AM 02/15/2022    8:10 AM  CMP  Glucose 70 - 99 mg/dL 118  106  132   BUN 6 - 20 mg/dL '12  12  15   '$ Creatinine 0.44 - 1.00 mg/dL 0.82  0.92  0.98   Sodium 135 - 145 mmol/L 137  137  138   Potassium 3.5 -  5.1 mmol/L 4.2  4.5  4.5   Chloride 98 - 111 mmol/L 106  103  102   CO2 22 - 32 mmol/L '26  27  27   '$ Calcium 8.9 - 10.3 mg/dL 8.7  9.0  9.0   Total Protein 6.5 - 8.1 g/dL 7.2  6.9  6.7   Total Bilirubin 0.3 - 1.2 mg/dL 0.5  0.6  0.6   Alkaline Phos 38 - 126 U/L 64  76  66    AST 15 - 41 U/L '17  13  12   '$ ALT 0 - 44 U/L '15  22  13     '$ Iron/TIBC/Ferritin/ %Sat    Component Value Date/Time   IRON 82 07/22/2022 0938   TIBC 399 07/22/2022 0938   FERRITIN 13 07/22/2022 0938   IRONPCTSAT 21 07/22/2022 0938   IRONPCTSAT 19 02/15/2021 9458

## 2022-07-25 NOTE — Telephone Encounter (Signed)
Pt called back and would like to know if Dr. Marcelline Mates has looked into other options for her. Aware Dr. Marcelline Mates is not in the office today.

## 2022-07-25 NOTE — Telephone Encounter (Signed)
Pt left msg on triage f/u on her medication. I called her back, no answer, LVMTRC.

## 2022-07-29 ENCOUNTER — Telehealth: Payer: Self-pay | Admitting: Obstetrics and Gynecology

## 2022-07-29 ENCOUNTER — Other Ambulatory Visit: Payer: Self-pay

## 2022-07-29 MED ORDER — SEMAGLUTIDE-WEIGHT MANAGEMENT 2.4 MG/0.75ML ~~LOC~~ SOAJ
2.4000 mg | SUBCUTANEOUS | 6 refills | Status: DC
  Filled 2022-07-29: qty 3, fill #0

## 2022-07-29 MED ORDER — SEMAGLUTIDE-WEIGHT MANAGEMENT 1.7 MG/0.75ML ~~LOC~~ SOAJ
1.7000 mg | SUBCUTANEOUS | 0 refills | Status: AC
  Filled 2022-07-29: qty 3, 28d supply, fill #0

## 2022-07-29 MED ORDER — SEMAGLUTIDE-WEIGHT MANAGEMENT 1 MG/0.5ML ~~LOC~~ SOAJ
1.0000 mg | SUBCUTANEOUS | 1 refills | Status: AC
  Filled 2022-07-29: qty 2, fill #0
  Filled 2022-07-31: qty 2, 28d supply, fill #0

## 2022-07-29 NOTE — Addendum Note (Signed)
Addended by: Augusto Gamble on: 07/29/2022 04:33 PM   Modules accepted: Orders

## 2022-07-29 NOTE — Telephone Encounter (Signed)
Contacted patient to inform that we  have the 0.1 mg dosing at the [place].  Still not available at her local [place]. Will change prescription to new pharmacy and instructed patient on use.   Dr. Marcelline Mates

## 2022-07-31 ENCOUNTER — Other Ambulatory Visit: Payer: Self-pay

## 2022-08-01 ENCOUNTER — Other Ambulatory Visit: Payer: Self-pay

## 2022-08-06 ENCOUNTER — Other Ambulatory Visit: Payer: Self-pay | Admitting: Obstetrics and Gynecology

## 2022-08-06 ENCOUNTER — Encounter: Payer: Self-pay | Admitting: Obstetrics and Gynecology

## 2022-08-06 MED ORDER — SAXENDA 18 MG/3ML ~~LOC~~ SOPN
PEN_INJECTOR | SUBCUTANEOUS | 1 refills | Status: DC
  Filled 2022-08-06: qty 15, 30d supply, fill #0

## 2022-08-07 ENCOUNTER — Telehealth: Payer: Self-pay

## 2022-08-07 ENCOUNTER — Other Ambulatory Visit: Payer: Self-pay

## 2022-08-07 NOTE — Telephone Encounter (Signed)
Pt is calling wondering if there needed to be a PA for her medication or was this the cheapest?

## 2022-08-08 ENCOUNTER — Other Ambulatory Visit: Payer: Self-pay

## 2022-08-16 NOTE — Telephone Encounter (Signed)
Unfortunately not.  All of the medications for weight loss sadly come at some cost outside of the Phentermine, which of course was not really helping her. Everyone's insurance is different so everyone deals with different costs based on what is covered

## 2022-09-17 NOTE — Progress Notes (Unsigned)
    GYNECOLOGY PROGRESS NOTE  Subjective:    Patient ID: Shelia Chapman, female    DOB: [birth year], 46 y.o.   MRN: [id number]  HPI  Patient is a 46 y.o. female who presents for {NUMBER 1-10:22536} month weight management follow up. She has a past history of obesity, ***. She initiated use of *** {NUMBER 1-10:22536} months ago.  Denies any undesirable side effects and reports compliance with medications.    Current interventions:  1. Diet -  2. Activity -  3. Reports bowel movements are ***.    {Common ambulatory SmartLinks:19316}  Review of Systems {ros; complete:30496}   Objective:       07/24/2022    1:38 PM 07/16/2022    8:50 AM 07/09/2022    9:13 AM  Vitals with BMI  Height  '5\' 3"'$  '5\' 3"'$   Weight 290 lbs 13 oz 290 lbs 13 oz 288 lbs 10 oz  BMI 51.53 69.50 72.25  Systolic 750 518 335  Diastolic 67 56 60  Pulse 88 85 94    General appearance: {general exam:16600} Abdomen: soft, non-tender.  Waist circumference *** in.    Labs:   Assessment:   Weight management Obesity, There is no height or weight on file to calculate BMI.  Plan:   Weight management  - doing well with weight loss, can continue current management.      Landis Gandy, CMA [facility]

## 2022-09-18 ENCOUNTER — Other Ambulatory Visit: Payer: Self-pay | Admitting: Obstetrics and Gynecology

## 2022-09-18 ENCOUNTER — Other Ambulatory Visit: Payer: Self-pay

## 2022-09-18 ENCOUNTER — Ambulatory Visit (INDEPENDENT_AMBULATORY_CARE_PROVIDER_SITE_OTHER): Payer: 59

## 2022-09-18 ENCOUNTER — Encounter: Payer: Self-pay | Admitting: Obstetrics and Gynecology

## 2022-09-18 ENCOUNTER — Ambulatory Visit (INDEPENDENT_AMBULATORY_CARE_PROVIDER_SITE_OTHER): Payer: 59 | Admitting: Obstetrics and Gynecology

## 2022-09-18 VITALS — BP 142/94 | HR 96 | Wt 288.9 lb

## 2022-09-18 DIAGNOSIS — Z862 Personal history of diseases of the blood and blood-forming organs and certain disorders involving the immune mechanism: Secondary | ICD-10-CM

## 2022-09-18 DIAGNOSIS — K439 Ventral hernia without obstruction or gangrene: Secondary | ICD-10-CM

## 2022-09-18 DIAGNOSIS — D259 Leiomyoma of uterus, unspecified: Secondary | ICD-10-CM

## 2022-09-18 DIAGNOSIS — E785 Hyperlipidemia, unspecified: Secondary | ICD-10-CM

## 2022-09-18 DIAGNOSIS — Z6841 Body Mass Index (BMI) 40.0 and over, adult: Secondary | ICD-10-CM

## 2022-10-21 ENCOUNTER — Telehealth: Payer: Self-pay

## 2022-10-21 ENCOUNTER — Ambulatory Visit: Payer: 59

## 2022-10-21 ENCOUNTER — Ambulatory Visit: Payer: 59 | Admitting: Family Medicine

## 2022-10-21 NOTE — Progress Notes (Deleted)
    NURSE VISIT NOTE  Subjective:    Patient ID: Shelia Chapman, female    DOB: [birth year], 47 y.o.   MRN: [id number]  HPI  Patient is a 47 y.o. G0P0000 female who presents for lupron depo injection.   Objective:    There were no vitals taken for this visit.   Leuprolide IM given by: Levert Feinstein, CMA. Site: {AOB INJ [id number]  Lab Review  @THIS$  VISIT ONLY@  Assessment:   No diagnosis found.   Plan:   Next appointment due in 3 months    Landis Gandy, [address]

## 2022-10-22 ENCOUNTER — Ambulatory Visit (INDEPENDENT_AMBULATORY_CARE_PROVIDER_SITE_OTHER): Payer: 59 | Admitting: Nurse Practitioner

## 2022-10-22 ENCOUNTER — Encounter: Payer: Self-pay | Admitting: Nurse Practitioner

## 2022-10-22 VITALS — BP 136/88 | HR 116 | Temp 98.1°F | Ht 63.0 in | Wt 283.4 lb

## 2022-10-22 DIAGNOSIS — Z6841 Body Mass Index (BMI) 40.0 and over, adult: Secondary | ICD-10-CM

## 2022-10-22 DIAGNOSIS — D259 Leiomyoma of uterus, unspecified: Secondary | ICD-10-CM

## 2022-10-22 DIAGNOSIS — E66813 Obesity, class 3: Secondary | ICD-10-CM

## 2022-10-22 DIAGNOSIS — E785 Hyperlipidemia, unspecified: Secondary | ICD-10-CM | POA: Diagnosis not present

## 2022-10-22 DIAGNOSIS — Z0000 Encounter for general adult medical examination without abnormal findings: Secondary | ICD-10-CM

## 2022-10-22 DIAGNOSIS — Z8639 Personal history of other endocrine, nutritional and metabolic disease: Secondary | ICD-10-CM | POA: Diagnosis not present

## 2022-10-22 DIAGNOSIS — E538 Deficiency of other specified B group vitamins: Secondary | ICD-10-CM

## 2022-10-22 DIAGNOSIS — E559 Vitamin D deficiency, unspecified: Secondary | ICD-10-CM

## 2022-10-22 NOTE — Assessment & Plan Note (Signed)
Continue follow up with OB-GYN for management and Lupron Depo injections.

## 2022-10-22 NOTE — Assessment & Plan Note (Addendum)
Discussed importance of healthy diet and exercise. Patient will continue seeing [facility] Weight [facility] for Phentermine.

## 2022-10-22 NOTE — Assessment & Plan Note (Signed)
Chronic. Stable on Pravastatin '20mg'$  daily. Continue. Encouraged healthy diet and exercise.

## 2022-10-22 NOTE — Patient Instructions (Addendum)
It was nice to meet you!   Please continue to work on diet and exercise.   Continue medications as prescribed.   Follow up with Weight Management, OB-GYN and Hematology as scheduled.

## 2022-10-22 NOTE — Progress Notes (Signed)
[name], NP-C Phone: [phone]  Shelia Chapman is a 47 y.o. female who presents today for transfer of care. She has no complaints or new concerns today. She is doing well on all of her medications.   HYPERLIPIDEMIA Symptoms Chest pain on exertion:  No   Leg claudication:   No Medications: Compliance- Pravastatin '20mg'$  daily Right upper quadrant pain- No  Muscle aches- No Lipid Panel     Component Value Date/Time   CHOL 179 07/09/2022 1013   TRIG 122.0 07/09/2022 1013   HDL 33.70 (L) 07/09/2022 1013   CHOLHDL 5 07/09/2022 1013   VLDL 24.4 07/09/2022 1013   LDLCALC 121 (H) 07/09/2022 1013   Obesity: Patient is seeing [facility] Weight [facility] for help with weight loss. She is currently taking Phentermine daily. She reports that she does not exercise and has a hard time dieting.    Social History   Tobacco Use  Smoking Status Never  Smokeless Tobacco Never    Current Outpatient Medications on File Prior to Visit  Medication Sig Dispense Refill   baclofen (LIORESAL) 10 MG tablet Take 10 mg by mouth 3 (three) times daily as needed.     Cholecalciferol (VITAMIN D3) 1.25 MG (50000 UT) CAPS Take 1 capsule by mouth once a week 13 capsule 0   CRYSELLE-28 0.3-30 MG-MCG tablet Take 1 tablet by mouth daily. Use as directed 90 tablet 3   FEROSUL 325 (65 Fe) MG tablet Take 325 mg by mouth daily.     gabapentin (NEURONTIN) 100 MG capsule 200 mg 2 (two) times daily. But taking M, W, F     GILENYA 0.5 MG CAPS Take 1 capsule by mouth daily.     leuprolide (LUPRON DEPOT, 75-MONTH,) 11.25 MG injection Inject 11.25 mg into the muscle every 3 (three) months. 1 each 2   LUMIGAN 0.01 % SOLN 1 drop at bedtime.     phentermine 37.5 MG capsule Take 37.5 mg by mouth every morning.     pravastatin (PRAVACHOL) 20 MG tablet Take 1 tablet (20 mg total) by mouth daily. After 6 pm 90 tablet 3   vitamin B-12 (CYANOCOBALAMIN) 1000 MCG tablet Take 1 tablet (1,000 mcg total) by mouth daily. 90 tablet 1    Current Facility-Administered Medications on File Prior to Visit  Medication Dose Route Frequency Provider Last Rate Last Admin   leuprolide (LUPRON) injection 11.25 mg  11.25 mg Intramuscular Q90 days Rubie Maid, MD   11.25 mg at 07/16/22 0957     ROS see history of present illness  Objective  Physical Exam Vitals:   10/22/22 1319 10/22/22 1320  BP: 138/88 136/88  Pulse:    Temp:    SpO2:      BP Readings from Last 3 Encounters:  10/22/22 136/88  09/18/22 (!) 142/94  07/24/22 129/67   Wt Readings from Last 3 Encounters:  10/22/22 283 lb 6.4 oz (128.5 kg)  09/18/22 288 lb 14.4 oz (131 kg)  07/24/22 290 lb 12.8 oz (131.9 kg)    Physical Exam Constitutional:      Appearance: Normal appearance. She is obese.  HENT:     Head: Normocephalic.  Cardiovascular:     Rate and Rhythm: Regular rhythm. Tachycardia present.     Heart sounds: Normal heart sounds.  Pulmonary:     Effort: Pulmonary effort is normal. No respiratory distress.     Breath sounds: Normal breath sounds.  Skin:    General: Skin is warm and dry.  Neurological:  Mental Status: She is alert.  Psychiatric:        Mood and Affect: Mood normal.        Behavior: Behavior normal.    Assessment/Plan: Please see individual problem list.  Hyperlipidemia, unspecified hyperlipidemia type Assessment & Plan: Chronic. Stable on Pravastatin '20mg'$  daily. Continue. Encouraged healthy diet and exercise.   Orders: -     Comprehensive metabolic panel; Future -     Lipid panel; Future  Class 3 severe obesity due to excess calories with serious comorbidity and body mass index (BMI) of 50.0 to 59.9 in adult [facility]) Assessment & Plan: Discussed importance of healthy diet and exercise. Patient will continue seeing Medical Weight Management for Phentermine.  Orders: -     Hemoglobin A1c; Future  Uterine leiomyoma, unspecified location Assessment & Plan: Continue follow up with OB-GYN for management and Lupron  Depo injections.    History of iron deficiency -     CBC with Differential/Platelet; Future  Preventative health care -     Comprehensive metabolic panel; Future -     Hemoglobin A1c; Future -     TSH; Future  Vitamin D deficiency -     VITAMIN D 25 Hydroxy (Vit-D Deficiency, Fractures); Future  B12 deficiency -     Vitamin B12; Future   Return in about 6 months (around 04/22/2023) for Annual Exam. Please complete labs 2-3 days prior.   [name], NP-C [name]

## 2022-10-23 NOTE — Telephone Encounter (Signed)
Patient aware will pick up today and schedule apt.

## 2022-10-29 ENCOUNTER — Ambulatory Visit (INDEPENDENT_AMBULATORY_CARE_PROVIDER_SITE_OTHER): Payer: 59

## 2022-10-29 VITALS — BP 148/84 | HR 89 | Ht 63.0 in | Wt 284.0 lb

## 2022-10-29 DIAGNOSIS — D219 Benign neoplasm of connective and other soft tissue, unspecified: Secondary | ICD-10-CM

## 2022-10-29 MED ORDER — LEUPROLIDE ACETATE (3 MONTH) 11.25 MG IM KIT
11.2500 mg | PACK | Freq: Once | INTRAMUSCULAR | Status: AC
  Administered 2022-10-29: 11.25 mg via INTRAMUSCULAR

## 2022-10-29 NOTE — Progress Notes (Signed)
    NURSE VISIT NOTE  Subjective:    Patient ID: Shelia Chapman, female    DOB: Mar 05, 1976, 47 y.o.   MRN: [id number]  HPI  Patient is a 47 y.o. G0P0000 female who presents for Depo Lupron injection.   Objective:    BP (!) 148/84   Pulse 89   Ht '5\' 3"'$  (1.6 m)   Wt 284 lb (128.8 kg)   BMI 50.31 kg/m   Indication for injection:  FibroidsContraception  Serum HCG indicated? No . Order to administer given by Rubie Maid, MD . Lupron Depot 11.25 mg IM given by: Levert Feinstein, CMA. Site: Right Deltoid per pt .   Assessment:   1. Fibroids      Plan:   as scheduled.    Landis Gandy, [place]

## 2022-11-06 NOTE — Telephone Encounter (Signed)
Patient dropped off a parking placard to be filled out. Form is up front in Kacy's color folder.

## 2022-11-07 NOTE — Telephone Encounter (Signed)
Form has been signed by provider and placed in an envelope with pts name and has been placed in the designated are.

## 2023-01-16 ENCOUNTER — Ambulatory Visit (INDEPENDENT_AMBULATORY_CARE_PROVIDER_SITE_OTHER): Payer: 59

## 2023-01-16 DIAGNOSIS — N852 Hypertrophy of uterus: Secondary | ICD-10-CM | POA: Diagnosis not present

## 2023-01-16 DIAGNOSIS — D259 Leiomyoma of uterus, unspecified: Secondary | ICD-10-CM

## 2023-01-20 NOTE — Progress Notes (Unsigned)
    GYNECOLOGY PROGRESS NOTE  Subjective:    Patient ID: Shelia Chapman, female    DOB: [birth year], 47 y.o.   MRN: [id number]  HPI  Patient is a 47 y.o. G0P0000 female who presents for follow up after ultrasound. She had an ultrasound on 01/16/2023 for enlarged uterus and fibroids. She has been taking Depo Lupron injections to decrease the size of the fibroids. There does appear to be some decrease in your fibroid size since the last ultrasound. It appears that the Lupron injections are helping.   {Common ambulatory SmartLinks:19316}  Review of Systems {ros; complete:30496}   Objective:   There were no vitals taken for this visit. There is no height or weight on file to calculate BMI. General appearance: {general exam:16600} Abdomen: {abdominal exam:16834} Pelvic: {pelvic exam:16852::"cervix normal in appearance","external genitalia normal","no adnexal masses or tenderness","no cervical motion tenderness","rectovaginal septum normal","uterus normal size, shape, and consistency","vagina normal without discharge"} Extremities: {extremity exam:5109} Neurologic: {neuro exam:17854}  Ultrasound Report:  Location: [facility] Date of Service: 01/16/2023    Indications:Enlarged Uterus Findings:  The uterus is enlarged and anteverted and measures 12.62 x 1131 x 14.80 cm. Echo texture is heterogenous with evidence of focal masses. Within the uterus are multiple suspected fibroids. The largest two measuring: Fibroid 1:10.2 x 9.56 x 9.4 cm Fibroid 2: 6.9 x 6.1 x 4.54 cm - pedunculated to the LT of the uterine body   The Endometrium measures 11.82 mm.   Right Ovary is not well visualized due to uterine enlargement. Left Ovary not well visualized due to uterine enlargement. Survey of the adnexa demonstrates no adnexal masses. There is no free fluid in the cul de sac.   Impression: 1. Enlarged uterus with multiple large fibroids.   Recommendations: 1.Clinical  correlation with the patient's History and Physical Exam.  Assessment:   No diagnosis found.   Plan:   There are no diagnoses linked to this encounter.   Hildred Laser, MD [facility]

## 2023-01-21 ENCOUNTER — Other Ambulatory Visit: Payer: Self-pay

## 2023-01-21 ENCOUNTER — Encounter: Payer: Self-pay | Admitting: Obstetrics and Gynecology

## 2023-01-21 ENCOUNTER — Ambulatory Visit (INDEPENDENT_AMBULATORY_CARE_PROVIDER_SITE_OTHER): Payer: 59 | Admitting: Obstetrics and Gynecology

## 2023-01-21 VITALS — BP 122/76 | HR 113 | Resp 16 | Ht 63.0 in | Wt 277.8 lb

## 2023-01-21 DIAGNOSIS — D259 Leiomyoma of uterus, unspecified: Secondary | ICD-10-CM | POA: Diagnosis not present

## 2023-01-21 DIAGNOSIS — D219 Benign neoplasm of connective and other soft tissue, unspecified: Secondary | ICD-10-CM

## 2023-01-21 DIAGNOSIS — E785 Hyperlipidemia, unspecified: Secondary | ICD-10-CM | POA: Diagnosis not present

## 2023-01-21 DIAGNOSIS — Z6841 Body Mass Index (BMI) 40.0 and over, adult: Secondary | ICD-10-CM

## 2023-01-21 DIAGNOSIS — K439 Ventral hernia without obstruction or gangrene: Secondary | ICD-10-CM

## 2023-01-21 MED ORDER — LUPRON DEPOT (1-MONTH) 3.75 MG IM KIT
3.7500 mg | PACK | INTRAMUSCULAR | 2 refills | Status: AC
  Filled 2023-01-21 – 2023-01-23 (×2): qty 1, 28d supply, fill #0
  Filled 2023-02-12 – 2023-02-13 (×2): qty 1, 28d supply, fill #1
  Filled 2023-03-26: qty 1, 28d supply, fill #2

## 2023-01-21 MED ORDER — SEMAGLUTIDE-WEIGHT MANAGEMENT 0.5 MG/0.5ML ~~LOC~~ SOAJ
0.5000 mg | SUBCUTANEOUS | 0 refills | Status: AC
  Filled 2023-01-21: qty 2, 28d supply, fill #0

## 2023-01-21 MED ORDER — SEMAGLUTIDE-WEIGHT MANAGEMENT 2.4 MG/0.75ML ~~LOC~~ SOAJ
2.4000 mg | SUBCUTANEOUS | 6 refills | Status: DC
  Filled 2023-01-21: qty 2, fill #0

## 2023-01-21 MED ORDER — SEMAGLUTIDE-WEIGHT MANAGEMENT 0.25 MG/0.5ML ~~LOC~~ SOAJ
0.2500 mg | SUBCUTANEOUS | 0 refills | Status: AC
  Filled 2023-01-21: qty 2, 28d supply, fill #0

## 2023-01-21 MED ORDER — SEMAGLUTIDE-WEIGHT MANAGEMENT 1.7 MG/0.75ML ~~LOC~~ SOAJ: 1.7000 mg | SUBCUTANEOUS | 0 refills | Status: DC

## 2023-01-21 MED ORDER — SEMAGLUTIDE-WEIGHT MANAGEMENT 1 MG/0.5ML ~~LOC~~ SOAJ: 1.0000 mg | SUBCUTANEOUS | 0 refills | Status: AC

## 2023-01-22 ENCOUNTER — Telehealth: Payer: Self-pay

## 2023-01-22 NOTE — Telephone Encounter (Signed)
See Prior authorization spreadsheet/document in media for key/info

## 2023-01-22 NOTE — Telephone Encounter (Signed)
Fax request for prior authorization on leuprolide (LUPRON DEPOT, 41-MONTH,) 3.75 MG injection

## 2023-01-23 ENCOUNTER — Other Ambulatory Visit: Payer: Self-pay

## 2023-01-24 ENCOUNTER — Other Ambulatory Visit: Payer: Self-pay

## 2023-01-27 ENCOUNTER — Other Ambulatory Visit: Payer: Self-pay

## 2023-01-28 ENCOUNTER — Other Ambulatory Visit: Payer: Self-pay

## 2023-01-28 ENCOUNTER — Ambulatory Visit (INDEPENDENT_AMBULATORY_CARE_PROVIDER_SITE_OTHER): Payer: 59

## 2023-01-28 VITALS — BP 134/68 | HR 89 | Ht 63.0 in | Wt 281.1 lb

## 2023-01-28 DIAGNOSIS — D259 Leiomyoma of uterus, unspecified: Secondary | ICD-10-CM

## 2023-01-28 DIAGNOSIS — D219 Benign neoplasm of connective and other soft tissue, unspecified: Secondary | ICD-10-CM

## 2023-01-28 MED ORDER — LEUPROLIDE ACETATE 3.75 MG IM KIT
3.7500 mg | PACK | Freq: Once | INTRAMUSCULAR | Status: AC
Start: 2023-01-28 — End: 2023-01-28
  Administered 2023-01-28: 3.75 mg via INTRAMUSCULAR

## 2023-01-28 NOTE — Progress Notes (Signed)
    NURSE VISIT NOTE  Subjective:    Patient ID: [name], female    DOB: [birth year], 47 y.o.   MRN: [id number]  HPI  Patient is a 47 y.o. G0P0000 female who presents for third Depo Lupron injection.   Objective:    There were no vitals taken for this visit.  Indication for injection:  Fibroids LMP:  01/01/23 Contraception:   Oral Contraceptive Serum HCG indicated? No . Lupron order:  3.75 mg IM x 3 dosages.   Order to administer given by Hildred Laser, MD on 01/21/23. Lupron Depot 3.75 mg IM given by: Rocco Serene, LPN. Site: Left Upper Outer Quandrant  Lab Review    Assessment:   1. Fibroids      Plan:   1 month for Lupron injection.    Rocco Serene, LPN

## 2023-01-28 NOTE — Patient Instructions (Signed)
Leuprolide Suspension for Injection (Endometriosis) What is this medication? LEUPROLIDE (loo PROE lide) treats endometriosis. This is a condition where the tissue that lines the uterus grows outside the uterus. It may also be used to treat uterine fibroids, growths of tissue in the uterus. It works by decreasing the amount of estrogen your body makes, which reduces heavy bleeding and pain. This medicine may be used for other purposes; ask your health care provider or pharmacist if you have questions. COMMON BRAND NAME(S): Lupron Depot What should I tell my care team before I take this medication? They need to know if you have any of these conditions: Frequently drink alcohol Mental health conditions Osteoporosis, weak bones Seizures Tobacco use Unexplained vaginal bleeding An unusual or allergic reaction to leuprolide, other medications, foods, dyes, or preservatives Pregnant or trying to get pregnant Breast-feeding How should I use this medication? This medication is injected into a muscle. You will be taught how to prepare and give it. Take it as directed on the prescription label at the same time every day. Keep taking it unless your care team tells you to stop. It is important that you put your used needles and syringes in a special sharps container. Do not put them in a trash can. If you do not have a sharps container, call your care team to get one. Talk to your care team about the use of this medication in children. Special care may be needed. Overdosage: If you think you have taken too much of this medicine contact a poison control center or emergency room at once. NOTE: This medicine is only for you. Do not share this medicine with others. What if I miss a dose? If you miss a dose, take it as soon as you can. If it is almost time for your next dose, take only that dose. Do not take double or extra doses. What may interact with this medication? Do not take this medication with any of  the following: Cisapride Dronedarone Ketoconazole Levoketoconazole Pimozide Thioridazine This list may not describe all possible interactions. Give your health care provider a list of all the medicines, herbs, non-prescription drugs, or dietary supplements you use. Also tell them if you smoke, drink alcohol, or use illegal drugs. Some items may interact with your medicine. What should I watch for while using this medication? Visit your care team for regular checks on your progress. Tell your care team if your symptoms do not start to get better or if they get worse. During the first weeks of therapy, your symptoms may get worse, then should improve as you continue treatment. You may experience a menstrual cycle or spotting during the first month of therapy. If this continues, contact your care team. Talk to your care team if you wish to become pregnant or think you might be pregnant. This medication can cause serious birth defects. A barrier contraceptive, such as a condom or diaphragm, is recommended while taking this medication. Talk to your care team about reliable forms of contraception. This medication may cause infertility. It is usually temporary. Talk to your care team if you are concerned about your fertility. Using this medication for a long time may weaken your bones. The risk of bone fractures may be increased. Talk to your care team about your bone health. What side effects may I notice from receiving this medication? Side effects that you should report to your care team as soon as possible: Allergic reactions--skin rash, itching, hives, swelling of the face, lips,   tongue, or throat Seizures Worsening mood, feelings of depression Side effects that usually do not require medical attention (report these to your care team if they continue or are bothersome): Acne Change in sex drive or performance Dizziness Headache Hot flashes Vaginal discharge Weight gain This list may not  describe all possible side effects. Call your doctor for medical advice about side effects. You may report side effects to FDA at 1-800-FDA-1088. Where should I keep my medication? Keep out of the reach of children and pets. Store at room temperature between 20 and 25 degrees C (68 and 77 degrees F). Get rid of any unused medication after the expiration date. To get rid of medications that are no longer needed or have expired: Take the medication to a medication take-back program. Check with your pharmacy or law enforcement to find a location. If you cannot return the medication, ask your pharmacist or care team how to get rid of this medication safely. NOTE: This sheet is a summary. It may not cover all possible information. If you have questions about this medicine, talk to your doctor, pharmacist, or health care provider.  2023 Elsevier/Gold Standard (2021-12-21 00:00:00)  

## 2023-02-03 ENCOUNTER — Other Ambulatory Visit: Payer: Self-pay

## 2023-02-03 DIAGNOSIS — E785 Hyperlipidemia, unspecified: Secondary | ICD-10-CM

## 2023-02-03 MED ORDER — PRAVASTATIN SODIUM 20 MG PO TABS
20.0000 mg | ORAL_TABLET | Freq: Every day | ORAL | 3 refills | Status: DC
Start: 2023-02-03 — End: 2024-03-18

## 2023-02-12 ENCOUNTER — Other Ambulatory Visit: Payer: Self-pay

## 2023-02-13 ENCOUNTER — Other Ambulatory Visit: Payer: Self-pay

## 2023-02-14 ENCOUNTER — Other Ambulatory Visit: Payer: Self-pay

## 2023-02-14 DIAGNOSIS — Z3041 Encounter for surveillance of contraceptive pills: Secondary | ICD-10-CM

## 2023-02-14 MED ORDER — CRYSELLE-28 0.3-30 MG-MCG PO TABS
1.0000 | ORAL_TABLET | Freq: Every day | ORAL | 3 refills | Status: DC
Start: 2023-02-14 — End: 2023-03-31

## 2023-02-17 ENCOUNTER — Other Ambulatory Visit: Payer: Self-pay

## 2023-02-27 ENCOUNTER — Ambulatory Visit (INDEPENDENT_AMBULATORY_CARE_PROVIDER_SITE_OTHER): Payer: 59

## 2023-02-27 VITALS — BP 131/47 | HR 98 | Ht 63.0 in | Wt 283.0 lb

## 2023-02-27 DIAGNOSIS — D259 Leiomyoma of uterus, unspecified: Secondary | ICD-10-CM

## 2023-02-27 DIAGNOSIS — D219 Benign neoplasm of connective and other soft tissue, unspecified: Secondary | ICD-10-CM

## 2023-02-27 MED ORDER — LEUPROLIDE ACETATE 3.75 MG IM KIT
3.7500 mg | PACK | Freq: Once | INTRAMUSCULAR | Status: AC
  Administered 2023-02-27: 3.75 mg via INTRAMUSCULAR

## 2023-02-27 MED ORDER — LEUPROLIDE ACETATE (3 MONTH) 11.25 MG IM KIT: 11.2500 mg | PACK | Freq: Once | INTRAMUSCULAR | Status: DC

## 2023-02-27 NOTE — Progress Notes (Addendum)
    NURSE VISIT NOTE  Subjective:    Patient ID: [name], female    DOB: October 02, 1976, 47 y.o.   MRN: [id number]  HPI  Patient is a 47 y.o. G0P0000 female who presents for fourth Depo Lupron injection.   Objective:    BP (!) 131/47   Pulse 98   Ht 5\' 3"  (1.6 m)   Wt 283 lb (128.4 kg)   LMP 01/01/2023 (Approximate)   BMI 50.13 kg/m   Indication for injection:  Fibroids LMP: Unsure Contraception:   none Serum HCG indicated? No . Lupron order:  3.75mg  IM  Order to administer given by Hildred Laser, MD on 01/21/23. . Lupron Depot 3.75 mg IM given by: Beverely Pace, CMA. Site: Left Upper Outer Quandrant   Assessment:   1. Fibroids      Plan:   28 days per Dr.Cherry .    Loney Laurence, CMA  ;

## 2023-03-26 ENCOUNTER — Other Ambulatory Visit: Payer: Self-pay

## 2023-03-27 ENCOUNTER — Ambulatory Visit: Payer: 59

## 2023-03-27 ENCOUNTER — Other Ambulatory Visit: Payer: Self-pay

## 2023-03-28 ENCOUNTER — Other Ambulatory Visit: Payer: Self-pay

## 2023-03-31 ENCOUNTER — Ambulatory Visit (INDEPENDENT_AMBULATORY_CARE_PROVIDER_SITE_OTHER): Payer: 59

## 2023-03-31 VITALS — BP 119/71 | HR 90 | Resp 16 | Ht 63.0 in | Wt 288.8 lb

## 2023-03-31 DIAGNOSIS — D259 Leiomyoma of uterus, unspecified: Secondary | ICD-10-CM | POA: Diagnosis not present

## 2023-03-31 DIAGNOSIS — D219 Benign neoplasm of connective and other soft tissue, unspecified: Secondary | ICD-10-CM

## 2023-03-31 MED ORDER — LEUPROLIDE ACETATE 3.75 MG IM KIT
3.7500 mg | PACK | Freq: Once | INTRAMUSCULAR | Status: AC
  Administered 2023-03-31: 3.75 mg via INTRAMUSCULAR

## 2023-03-31 NOTE — Progress Notes (Signed)
    NURSE VISIT NOTE  Subjective:    Patient ID: Shelia Chapman, female    DOB: 1976/05/17, 47 y.o.   MRN: [id number]  HPI  Patient is a 47 y.o. G0P0000 female who presents for fifth Depo Lupron injection. I asked patient if she was still taking her OCPs and she said yes. I advised her to discontinue OCP per blue sticky. She also wanted to know how much weight she was required to lose before going ahead with her surgery.   Objective:    BP 119/71   Pulse 90   Resp 16   Ht 5\' 3"  (1.6 m)   Wt 288 lb 12.8 oz (131 kg)   BMI 51.16 kg/m   Indication for injection:  Fibroids LMP:  Unsure Contraception:   None Serum HCG indicated? No . Lupron order:  37.5 mg IM x ? dosages.   Order to administer given by Hildred Laser, MD on 01/21/2023. Lupron Depot 3.75 mg IM given by: Santiago Bumpers, CMA. Site: Left Deltoid  Lab Review  @THIS  VISIT ONLY@  Assessment:   1. Fibroids      Plan:   Every 28 days .     Santiago Bumpers, CMA [facility]

## 2023-04-11 ENCOUNTER — Telehealth: Payer: Self-pay

## 2023-04-14 NOTE — Telephone Encounter (Signed)
The birth control sometimes can cancel or alter the effects of the Lupron, making it less effective.  If she desires hormones to utilize while on the Lupron, I would need to change her to something that does not have estrogen in it, such as Aygestin or Slynd.

## 2023-04-22 ENCOUNTER — Other Ambulatory Visit (INDEPENDENT_AMBULATORY_CARE_PROVIDER_SITE_OTHER): Payer: 59

## 2023-04-22 DIAGNOSIS — E785 Hyperlipidemia, unspecified: Secondary | ICD-10-CM | POA: Diagnosis not present

## 2023-04-22 DIAGNOSIS — Z8639 Personal history of other endocrine, nutritional and metabolic disease: Secondary | ICD-10-CM

## 2023-04-22 DIAGNOSIS — Z0000 Encounter for general adult medical examination without abnormal findings: Secondary | ICD-10-CM

## 2023-04-22 DIAGNOSIS — E538 Deficiency of other specified B group vitamins: Secondary | ICD-10-CM

## 2023-04-22 DIAGNOSIS — E559 Vitamin D deficiency, unspecified: Secondary | ICD-10-CM | POA: Diagnosis not present

## 2023-04-22 DIAGNOSIS — Z6841 Body Mass Index (BMI) 40.0 and over, adult: Secondary | ICD-10-CM | POA: Diagnosis not present

## 2023-04-22 LAB — CBC WITH DIFFERENTIAL/PLATELET
Basophils Absolute: 0 10*3/uL (ref 0.0–0.1)
Basophils Relative: 0.7 % (ref 0.0–3.0)
Eosinophils Absolute: 0.1 10*3/uL (ref 0.0–0.7)
Eosinophils Relative: 2.1 % (ref 0.0–5.0)
HCT: 41 % (ref 36.0–46.0)
Hemoglobin: 13.3 g/dL (ref 12.0–15.0)
Lymphocytes Relative: 15 % (ref 12.0–46.0)
Lymphs Abs: 0.9 10*3/uL (ref 0.7–4.0)
MCHC: 32.4 g/dL (ref 30.0–36.0)
MCV: 89 fl (ref 78.0–100.0)
Monocytes Absolute: 0.5 10*3/uL (ref 0.1–1.0)
Monocytes Relative: 8.2 % (ref 3.0–12.0)
Neutro Abs: 4.3 10*3/uL (ref 1.4–7.7)
Neutrophils Relative %: 74 % (ref 43.0–77.0)
Platelets: 323 10*3/uL (ref 150.0–400.0)
RBC: 4.6 Mil/uL (ref 3.87–5.11)
RDW: 13.7 % (ref 11.5–15.5)
WBC: 5.8 10*3/uL (ref 4.0–10.5)

## 2023-04-22 LAB — COMPREHENSIVE METABOLIC PANEL
ALT: 16 U/L (ref 0–35)
AST: 17 U/L (ref 0–37)
Albumin: 4 g/dL (ref 3.5–5.2)
Alkaline Phosphatase: 83 U/L (ref 39–117)
BUN: 15 mg/dL (ref 6–23)
CO2: 29 mEq/L (ref 19–32)
Calcium: 9.4 mg/dL (ref 8.4–10.5)
Chloride: 105 mEq/L (ref 96–112)
Creatinine, Ser: 0.97 mg/dL (ref 0.40–1.20)
GFR: 68.72 mL/min (ref >60.00)
Glucose, Bld: 99 mg/dL (ref 70–99)
Potassium: 4.7 mEq/L (ref 3.5–5.1)
Sodium: 141 mEq/L (ref 135–145)
Total Bilirubin: 0.6 mg/dL (ref 0.2–1.2)
Total Protein: 7.1 g/dL (ref 6.0–8.3)

## 2023-04-22 LAB — VITAMIN B12: Vitamin B-12: >1500 pg/mL — ABNORMAL HIGH (ref 211–911)

## 2023-04-22 LAB — LIPID PANEL
Cholesterol: 164 mg/dL (ref 0–200)
HDL: 36.2 mg/dL — ABNORMAL LOW (ref >39.00)
LDL Cholesterol: 108 mg/dL — ABNORMAL HIGH (ref 0–99)
NonHDL: 127.72
Total CHOL/HDL Ratio: 5
Triglycerides: 101 mg/dL (ref 0.0–149.0)
VLDL: 20.2 mg/dL (ref 0.0–40.0)

## 2023-04-22 LAB — TSH: TSH: 3.06 u[IU]/mL (ref 0.35–5.50)

## 2023-04-22 LAB — HEMOGLOBIN A1C: Hgb A1c MFr Bld: 5.7 % (ref 4.6–6.5)

## 2023-04-22 LAB — VITAMIN D 25 HYDROXY (VIT D DEFICIENCY, FRACTURES): VITD: 36.83 ng/mL (ref 30.00–100.00)

## 2023-04-24 ENCOUNTER — Encounter: Payer: 59 | Admitting: Nurse Practitioner

## 2023-04-25 ENCOUNTER — Other Ambulatory Visit: Payer: 59

## 2023-04-28 ENCOUNTER — Other Ambulatory Visit: Payer: Self-pay | Admitting: Obstetrics and Gynecology

## 2023-04-28 ENCOUNTER — Other Ambulatory Visit: Payer: Self-pay

## 2023-04-28 ENCOUNTER — Other Ambulatory Visit (HOSPITAL_COMMUNITY): Payer: Self-pay

## 2023-04-28 DIAGNOSIS — D219 Benign neoplasm of connective and other soft tissue, unspecified: Secondary | ICD-10-CM

## 2023-04-28 NOTE — Progress Notes (Unsigned)
Shelia Dicker, NP-C Phone: [phone]  Shelia Chapman is a 47 y.o. female who presents today for annual exam. She has no complaints or new concerns today. She is doing well on all of her medications. She is followed closely by Neurology and Ob-Gyn. She has been seeing [facility] in [place] for help with weight loss, she is needing to lose weight in order to have surgery to remove fibroids. She completed lab work prior to her appointment and would like to review her results.   Diet: Trying to improve- decreased portion sizes, not snacking as much Exercise: Gym 5 days per week- treadmill and light arm weights Pap smear: 03/21/2022- NILM, HPV negative Colonoscopy: 11/01/2021- 7 year recall Mammogram: 04/15/2022- Due! Family history-  Colon cancer: No  Breast cancer: No  Ovarian cancer: No Menses: LMP- approx. 04/03/2023, due this week, regular cycles, denies heavy bleeding Sexually active: No Vaccines-   Flu: Not due  Tetanus: Unsure- declined today  COVID19: x 3 HIV screening: Declined Hep C Screening: Declined Tobacco use: No Alcohol use: No Illicit Drug use: No Dentist: Yes Ophthalmology: Yes   Social History   Tobacco Use  Smoking Status Never  Smokeless Tobacco Never    Current Outpatient Medications on File Prior to Visit  Medication Sig Dispense Refill   baclofen (LIORESAL) 10 MG tablet Take 10 mg by mouth 3 (three) times daily as needed.     Cholecalciferol (VITAMIN D3) 1.25 MG (50000 UT) CAPS Take 1 capsule by mouth once a week 13 capsule 0   FEROSUL 325 (65 Fe) MG tablet Take 325 mg by mouth daily.     Fingolimod HCl 0.5 MG CAPS Take 0.5 mg by mouth. Take 1 tablet Mon, Wed, and Fr     gabapentin (NEURONTIN) 100 MG capsule 200 mg 2 (two) times daily. But taking M, W, F     leuprolide (LUPRON DEPOT, 70-MONTH,) 3.75 MG injection Inject 3.75 mg into the muscle every 28 (twenty-eight) days. 1 each 2   LUMIGAN 0.01 % SOLN 1 drop at bedtime.     metFORMIN  (GLUCOPHAGE-XR) 500 MG 24 hr tablet 500 mg.     pravastatin (PRAVACHOL) 20 MG tablet Take 1 tablet (20 mg total) by mouth daily. After 6 pm 90 tablet 3   vitamin B-12 (CYANOCOBALAMIN) 1000 MCG tablet Take 1 tablet (1,000 mcg total) by mouth daily. 90 tablet 1   No current facility-administered medications on file prior to visit.     ROS see history of present illness  Objective  Physical Exam Vitals:   04/29/23 0844 04/29/23 0915  BP: (!) 140/80 130/80  Pulse: 82   Temp: 98 F (36.7 C)   SpO2: 97%     BP Readings from Last 3 Encounters:  04/29/23 130/80  03/31/23 119/71  02/27/23 (!) 131/47   Wt Readings from Last 3 Encounters:  04/29/23 283 lb 3.2 oz (128.5 kg)  04/28/23 283 lb (128.4 kg)  03/31/23 288 lb 12.8 oz (131 kg)    Physical Exam Constitutional:      General: She is not in acute distress.    Appearance: Normal appearance. She is obese.  HENT:     Head: Normocephalic.     Right Ear: Tympanic membrane normal.     Left Ear: Tympanic membrane normal.     Nose: Nose normal.     Mouth/Throat:     Mouth: Mucous membranes are moist.     Pharynx: Oropharynx is clear.  Eyes:     Conjunctiva/sclera:  Conjunctivae normal.     Pupils: Pupils are equal, round, and reactive to light.  Neck:     Thyroid: No thyromegaly.  Cardiovascular:     Rate and Rhythm: Normal rate and regular rhythm.     Heart sounds: Normal heart sounds.  Pulmonary:     Effort: Pulmonary effort is normal.     Breath sounds: Normal breath sounds.  Abdominal:     General: Abdomen is flat. Bowel sounds are normal.     Palpations: Abdomen is soft. There is no mass.     Tenderness: There is no abdominal tenderness.  Musculoskeletal:        General: Normal range of motion.  Lymphadenopathy:     Cervical: No cervical adenopathy.  Skin:    General: Skin is warm and dry.     Findings: No rash.  Neurological:     General: No focal deficit present.     Mental Status: She is alert.   Psychiatric:        Mood and Affect: Mood normal.        Behavior: Behavior normal.    Assessment/Plan: Please see individual problem list.  Preventative health care Assessment & Plan: Physical exam complete. Lab results reviewed with patient. Pap- UTD. Colonoscopy- UTD. Mammogram- due, order placed, encouraged patient to call to schedule. Flu vaccine not due. Tetanus vaccine- due/unsure of last one, declined today. Encouraged patient to return for this or get at local pharmacy when ready. Declined additional COVID vaccines. HIV and Hep C screenings declined. Recommended follow ups with Dentist and Ophthalmology for annual exams. Encouraged to continue working on healthy diet and exercising. Return to care in 6 months, sooner PRN.    Multiple sclerosis (HCC) Assessment & Plan: Followed by Neurology. Last office visit 04/03/2023. Scheduled for follow up in August. Continue current medication regimen.    Class 3 severe obesity due to excess calories with serious comorbidity and body mass index (BMI) of 50.0 to 59.9 in adult [facility]) Assessment & Plan: Continue working with [facility] in [place] for help with weight loss. Currently on Metformin 500 mg daily. No longer on Semaglutide or Phentermine. Encouraged healthy diet and exercise.    Prediabetes Assessment & Plan: A1c- 5.7 in most recent lab work. Recently started on Metformin 500 mg daily with [facility] for help with weight loss. Will plan to recheck A1c in the future. She will continue the Metformin daily. Encouraged healthy diet and exercise.    Hyperlipidemia, unspecified hyperlipidemia type Assessment & Plan: Chronic. Stable on Pravastatin 20mg  daily. Continue. LDL improved- 108. Encouraged to continue healthy diet and exercise. The 10-year ASCVD risk score (Arnett DK, et al., 2019) is: 2.8%   Fibroids Assessment & Plan: Continue follow up with OB-Gyn for management and Lupron Depo injections. Trying to lose weight  in order to have surgery for removal.    B12 deficiency Assessment & Plan: Taking B-12 1000 mcg daily. B-12 level in recent lab work elevated, will plan to recheck level in the future. Will continue to monitor.    Screening mammogram for breast cancer -     3D Screening Mammogram, Left and Right; Future   Return in about 6 months (around 10/30/2023) for Follow up.   Shelia Dicker, NP-C [facility] [facility]

## 2023-04-29 ENCOUNTER — Ambulatory Visit (INDEPENDENT_AMBULATORY_CARE_PROVIDER_SITE_OTHER): Payer: 59 | Admitting: Nurse Practitioner

## 2023-04-29 ENCOUNTER — Encounter: Payer: 59 | Admitting: Nurse Practitioner

## 2023-04-29 ENCOUNTER — Encounter: Payer: Self-pay | Admitting: Nurse Practitioner

## 2023-04-29 VITALS — BP 130/80 | HR 82 | Temp 98.0°F | Ht 63.0 in | Wt 283.2 lb

## 2023-04-29 DIAGNOSIS — E785 Hyperlipidemia, unspecified: Secondary | ICD-10-CM

## 2023-04-29 DIAGNOSIS — Z6841 Body Mass Index (BMI) 40.0 and over, adult: Secondary | ICD-10-CM

## 2023-04-29 DIAGNOSIS — E538 Deficiency of other specified B group vitamins: Secondary | ICD-10-CM

## 2023-04-29 DIAGNOSIS — G35 Multiple sclerosis: Secondary | ICD-10-CM | POA: Diagnosis not present

## 2023-04-29 DIAGNOSIS — Z0000 Encounter for general adult medical examination without abnormal findings: Secondary | ICD-10-CM

## 2023-04-29 DIAGNOSIS — R7303 Prediabetes: Secondary | ICD-10-CM

## 2023-04-29 DIAGNOSIS — D219 Benign neoplasm of connective and other soft tissue, unspecified: Secondary | ICD-10-CM

## 2023-04-29 DIAGNOSIS — Z1231 Encounter for screening mammogram for malignant neoplasm of breast: Secondary | ICD-10-CM

## 2023-04-29 NOTE — Assessment & Plan Note (Addendum)
Chronic. Stable on Pravastatin 20mg  daily. Continue. LDL improved- 108. Encouraged to continue healthy diet and exercise. The 10-year ASCVD risk score (Arnett DK, et al., 2019) is: 2.8%

## 2023-04-29 NOTE — Assessment & Plan Note (Signed)
Taking B-12 1000 mcg daily. B-12 level in recent lab work elevated, will plan to recheck level in the future. Will continue to monitor.

## 2023-04-29 NOTE — Assessment & Plan Note (Signed)
Followed by Neurology. Last office visit 04/03/2023. Scheduled for follow up in August. Continue current medication regimen.

## 2023-04-29 NOTE — Assessment & Plan Note (Signed)
A1c- 5.7 in most recent lab work. Recently started on Metformin 500 mg daily with [facility] for help with weight loss. Will plan to recheck A1c in the future. She will continue the Metformin daily. Encouraged healthy diet and exercise.

## 2023-04-29 NOTE — Assessment & Plan Note (Signed)
Continue working with [facility] in [place] for help with weight loss. Currently on Metformin 500 mg daily. No longer on Semaglutide or Phentermine. Encouraged healthy diet and exercise.

## 2023-04-29 NOTE — Patient Instructions (Signed)
YOUR MAMMOGRAM IS DUE, PLEASE CALL AND GET THIS SCHEDULED! [facility] - call [phone]    

## 2023-04-29 NOTE — Assessment & Plan Note (Signed)
Continue follow up with OB-Gyn for management and Lupron Depo injections. Trying to lose weight in order to have surgery for removal.

## 2023-04-29 NOTE — Assessment & Plan Note (Signed)
Physical exam complete. Lab results reviewed with patient. Pap- UTD. Colonoscopy- UTD. Mammogram- due, order placed, encouraged patient to call to schedule. Flu vaccine not due. Tetanus vaccine- due/unsure of last one, declined today. Encouraged patient to return for this or get at local pharmacy when ready. Declined additional COVID vaccines. HIV and Hep C screenings declined. Recommended follow ups with Dentist and Ophthalmology for annual exams. Encouraged to continue working on healthy diet and exercising. Return to care in 6 months, sooner PRN.

## 2023-04-30 NOTE — Progress Notes (Signed)
Pts appointment was canceled due to she needs a u/s and f/up with Cherry . A user error has taken place: encounter opened in error, closed for administrative reasons.

## 2023-05-07 ENCOUNTER — Ambulatory Visit
Admission: RE | Admit: 2023-05-07 | Discharge: 2023-05-07 | Disposition: A | Discharge status: Home / Self Care | Payer: 59 | Source: Ambulatory Visit | Attending: Nurse Practitioner | Admitting: Nurse Practitioner

## 2023-05-07 DIAGNOSIS — Z1231 Encounter for screening mammogram for malignant neoplasm of breast: Secondary | ICD-10-CM | POA: Diagnosis not present

## 2023-05-14 ENCOUNTER — Other Ambulatory Visit: Payer: Self-pay | Admitting: Obstetrics and Gynecology

## 2023-05-14 ENCOUNTER — Ambulatory Visit (INDEPENDENT_AMBULATORY_CARE_PROVIDER_SITE_OTHER): Payer: 59

## 2023-05-14 DIAGNOSIS — N852 Hypertrophy of uterus: Secondary | ICD-10-CM

## 2023-05-14 DIAGNOSIS — D219 Benign neoplasm of connective and other soft tissue, unspecified: Secondary | ICD-10-CM

## 2023-06-09 ENCOUNTER — Ambulatory Visit (INDEPENDENT_AMBULATORY_CARE_PROVIDER_SITE_OTHER): Payer: 59 | Admitting: *Deleted

## 2023-06-09 VITALS — Ht 63.0 in | Wt 273.0 lb

## 2023-06-09 DIAGNOSIS — Z0000 Encounter for general adult medical examination without abnormal findings: Secondary | ICD-10-CM | POA: Diagnosis not present

## 2023-06-09 NOTE — Patient Instructions (Signed)
Shelia Chapman , Thank you for taking time to come for your Medicare Wellness Visit. I appreciate your ongoing commitment to your health goals. Please review the following plan we discussed and let me know if I can assist you in the future.   Referrals/Orders/Follow-Ups/Clinician Recommendations: None  This is a list of the screening recommended for you and due dates:  Health Maintenance  Topic Date Due   DTaP/Tdap/Td vaccine (1 - Tdap) Never done   COVID-19 Vaccine (4 - 2023-24 season) 06/14/2022   Flu Shot  05/15/2023   Mammogram  05/06/2024   Medicare Annual Wellness Visit  06/08/2024   Pap Smear  03/21/2025   Colon Cancer Screening  11/01/2028   HPV Vaccine  Aged Out   Hepatitis C Screening  Discontinued   HIV Screening  Discontinued    Advanced directives: (Declined) Advance directive discussed with you today. Even though you declined this today, please call our office should you change your mind, and we can give you the proper paperwork for you to fill out.  Next Medicare Annual Wellness Visit scheduled for next year: Yes 06/11/24 @ 10:30

## 2023-06-09 NOTE — Progress Notes (Signed)
Subjective:   Shelia Chapman is a 47 y.o. female who presents for Medicare Annual (Subsequent) preventive examination.  Visit Complete: Virtual  I connected with  Shelia Chapman on 06/09/23 by a audio enabled telemedicine application and verified that I am speaking with the correct person using two identifiers.  Patient Location: Home  Provider Location: Office/Clinic  I discussed the limitations of evaluation and management by telemedicine. The patient expressed understanding and agreed to proceed.  Vital Signs: Unable to obtain new vitals due to this being a telehealth visit.   Review of Systems     Cardiac Risk Factors include: obesity (BMI >30kg/m2);dyslipidemia     Objective:    Today's Vitals   06/09/23 1114  Weight: 273 lb (123.8 kg)  Height: 5\' 3"  (1.6 m)   Body mass index is 48.36 kg/m.     06/09/2023   11:33 AM 06/09/2023   11:32 AM 02/26/2022    9:55 AM 11/01/2021    7:11 AM 07/23/2021    1:58 PM 03/02/2021   11:22 AM  Advanced Directives  Does Patient Have a Medical Advance Directive? No Yes No No No No  Type of Special educational needs teacher of [place];Living will      Does patient want to make changes to medical advance directive?      No - Patient declined  Copy of Healthcare Power of Attorney in Chart?  No - copy requested      Would patient like information on creating a medical advance directive? No - Patient declined  No - Patient declined No - Patient declined      Current Medications (verified) Outpatient Encounter Medications as of 06/09/2023  Medication Sig   baclofen (LIORESAL) 10 MG tablet Take 10 mg by mouth 3 (three) times daily as needed.   Cholecalciferol (VITAMIN D3) 1.25 MG (50000 UT) CAPS Take 1 capsule by mouth once a week   FEROSUL 325 (65 Fe) MG tablet Take 325 mg by mouth daily.   Fingolimod HCl 0.5 MG CAPS Take 0.5 mg by mouth. Take 1 tablet Mon, Wed, and Fr   gabapentin (NEURONTIN) 100 MG capsule 200 mg 2 (two) times daily.  But taking M, W, F   leuprolide (LUPRON DEPOT, 65-MONTH,) 3.75 MG injection Inject 3.75 mg into the muscle every 28 (twenty-eight) days.   LUMIGAN 0.01 % SOLN 1 drop at bedtime.   metFORMIN (GLUCOPHAGE-XR) 500 MG 24 hr tablet 500 mg.   pravastatin (PRAVACHOL) 20 MG tablet Take 1 tablet (20 mg total) by mouth daily. After 6 pm   vitamin B-12 (CYANOCOBALAMIN) 1000 MCG tablet Take 1 tablet (1,000 mcg total) by mouth daily.   No facility-administered encounter medications on file as of 06/09/2023.    Allergies (verified) Patient has no known allergies.   History: Past Medical History:  Diagnosis Date   Anemia of unknown etiology 02/28/2016   Chicken pox    Depression    Frequent headaches    Glaucoma    Multiple sclerosis (HCC)    Sleep apnea    Urine incontinence    Past Surgical History:  Procedure Laterality Date   COLONOSCOPY WITH PROPOFOL N/A 11/01/2021   Procedure: COLONOSCOPY WITH PROPOFOL;  Surgeon: Wyline Mood, MD;  Location: [facility] ENDOSCOPY;  Service: Gastroenterology;  Laterality: N/A;   ESOPHAGOGASTRODUODENOSCOPY N/A 11/01/2021   Procedure: ESOPHAGOGASTRODUODENOSCOPY (EGD);  Surgeon: Wyline Mood, MD;  Location: [facility] ENDOSCOPY;  Service: Gastroenterology;  Laterality: N/A;   UTERINE FIBROID SURGERY     Family History  Problem Relation Age of Onset   Depression Mother    Diabetes Mother    Hyperlipidemia Mother    Hypertension Mother    Uterine cancer Mother    Diabetes Sister    Hypertension Sister    Breast cancer Neg Hx    Social History   Socioeconomic History   Marital status: Single    Spouse name: Not on file   Number of children: Not on file   Years of education: Not on file   Highest education level: Not on file  Occupational History   Not on file  Tobacco Use   Smoking status: Never   Smokeless tobacco: Never  Vaping Use   Vaping status: Never Used  Substance and Sexual Activity   Alcohol use: Never   Drug use: Never   Sexual activity: Not  Currently  Other Topics Concern   Not on file  Social History Narrative   Single, lives with sister   Social Determinants of Health   Financial Resource Strain: Low Risk  (06/09/2023)   Overall Financial Resource Strain (CARDIA)    Difficulty of Paying Living Expenses: Not hard at all  Food Insecurity: No Food Insecurity (06/09/2023)   Hunger Vital Sign    Worried About Running Out of Food in the Last Year: Never true    Ran Out of Food in the Last Year: Never true  Transportation Needs: No Transportation Needs (06/09/2023)   PRAPARE - Administrator, Civil Service (Medical): No    Lack of Transportation (Non-Medical): No  Physical Activity: Insufficiently Active (06/09/2023)   Exercise Vital Sign    Days of Exercise per Week: 3 days    Minutes of Exercise per Session: 40 min  Stress: No Stress Concern Present (06/09/2023)   [employer] of Occupational Health - Occupational Stress Questionnaire    Feeling of Stress : Not at all  Social Connections: Moderately Integrated (06/09/2023)   Social Connection and Isolation Panel [NHANES]    Frequency of Communication with Friends and Family: More than three times a week    Frequency of Social Gatherings with Friends and Family: Twice a week    Attends Religious Services: More than 4 times per year    Active Member of [employer] or Organizations: Yes    Attends Engineer, structural: More than 4 times per year    Marital Status: Never married    Tobacco Counseling Counseling given: Not Answered   Clinical Intake:  Pre-visit preparation completed: Yes  Pain : No/denies pain     BMI - recorded: 48.36 Nutritional Status: BMI > 30  Obese Nutritional Risks: None Diabetes: No  How often do you need to have someone help you when you read instructions, pamphlets, or other written materials from your doctor or pharmacy?: 1 - Never  Interpreter Needed?: No  Information entered by :: R. Kathia Covington LPN   Activities of  Daily Living    06/09/2023   11:17 AM  In your present state of health, do you have any difficulty performing the following activities:  Hearing? 0  Vision? 1  Comment glasses  Difficulty concentrating or making decisions? 1  Comment some issues with remembering  Walking or climbing stairs? 1  Dressing or bathing? 0  Doing errands, shopping? 1  Preparing Food and eating ? N  Using the Toilet? N  In the past six months, have you accidently leaked urine? N  Do you have problems with loss of bowel control? N  Managing your Medications? N  Managing your Finances? N  Housekeeping or managing your Housekeeping? N    Patient Care Team: [name], NP as PCP - General (Nurse Practitioner)  Indicate any recent Medical Services you may have received from other than [facility] providers in the past year (date may be approximate).     Assessment:   This is a routine wellness examination for Kyara.  Hearing/Vision screen Hearing Screening - Comments:: No issues Vision Screening - Comments:: glasses  Dietary issues and exercise activities discussed:     Goals Addressed             This Visit's Progress    Patient Stated       Wants to exercise more and eat better       Depression Screen    06/09/2023   11:25 AM 10/22/2022    1:10 PM 07/16/2022    8:53 AM 07/09/2022    9:22 AM 03/21/2022    9:18 AM 02/26/2022    9:56 AM 10/26/2021   10:12 AM  PHQ 2/9 Scores  PHQ - 2 Score 0 0 0 0 0 0 0  PHQ- 9 Score 3      3    Fall Risk    06/09/2023   11:21 AM 03/31/2023    9:06 AM 01/21/2023   10:24 AM 10/22/2022    1:10 PM 07/16/2022    8:52 AM  Fall Risk   Falls in the past year? 0 0 0 0 0  Number falls in past yr: 0 0 0 0 0  Injury with Fall? 0 0 0 0 0  Risk for fall due to : No Fall Risks No Fall Risks No Fall Risks No Fall Risks No Fall Risks  Follow up Falls prevention discussed;Falls evaluation completed Falls evaluation completed Falls evaluation completed Falls evaluation  completed Falls evaluation completed    MEDICARE RISK AT HOME: Medicare Risk at Home Any stairs in or around the home?: Yes If so, are there any without handrails?: No Home free of loose throw rugs in walkways, pet beds, electrical cords, etc?: Yes Adequate lighting in your home to reduce risk of falls?: Yes Life alert?: No Use of a cane, walker or w/c?: No Grab bars in the bathroom?: No Shower chair or bench in shower?: No Elevated toilet seat or a handicapped toilet?: No  Cognitive Function:        06/09/2023   11:33 AM  6CIT Screen  What Year? 0 points  What month? 0 points  What time? 0 points  Count back from 20 2 points  Months in reverse 2 points  Repeat phrase 6 points  Total Score 10 points    Immunizations Immunization History  Administered Date(s) Administered   Influenza,inj,Quad PF,6+ Mos 09/03/2021   PFIZER(Purple Top)SARS-COV-2 Vaccination 12/30/2019, 01/20/2020, 11/20/2020    TDAP status: Due, Education has been provided regarding the importance of this vaccine. Advised may receive this vaccine at local pharmacy or Health Dept. Aware to provide a copy of the vaccination record if obtained from local pharmacy or Health Dept. Verbalized acceptance and understanding.  Flu Vaccine status: Due, Education has been provided regarding the importance of this vaccine. Advised may receive this vaccine at local pharmacy or Health Dept. Aware to provide a copy of the vaccination record if obtained from local pharmacy or Health Dept. Verbalized acceptance and understanding.    Covid-19 vaccine status: Completed vaccines  Qualifies for Shingles Vaccine? No   Zostavax completed No  Shingrix Completed?: No.    Education has been provided regarding the importance of this vaccine. Patient has been advised to call insurance company to determine out of pocket expense if they have not yet received this vaccine. Advised may also receive vaccine at local pharmacy or Health  Dept. Verbalized acceptance and understanding.  Screening Tests Health Maintenance  Topic Date Due   DTaP/Tdap/Td (1 - Tdap) Never done   COVID-19 Vaccine (4 - 2023-24 season) 06/14/2022   Medicare Annual Wellness (AWV)  02/27/2023   INFLUENZA VACCINE  05/15/2023   MAMMOGRAM  05/06/2024   PAP SMEAR-Modifier  03/21/2025   Colonoscopy  11/01/2028   HPV VACCINES  Aged Out   Hepatitis C Screening  Discontinued   HIV Screening  Discontinued    Health Maintenance  Health Maintenance Due  Topic Date Due   DTaP/Tdap/Td (1 - Tdap) Never done   COVID-19 Vaccine (4 - 2023-24 season) 06/14/2022   Medicare Annual Wellness (AWV)  02/27/2023   INFLUENZA VACCINE  05/15/2023    Colorectal cancer screening: Type of screening: Colonoscopy. Completed 1/23. Repeat every 7 years  Mammogram status: Completed 7/24. Repeat every year   Lung Cancer Screening: (Low Dose CT Chest recommended if Age 65-80 years, 20 pack-year currently smoking OR have quit w/in 15years.) does not qualify.   Additional Screening:  Hepatitis C Screening: does not qualify; Completed NA age  Vision Screening: Recommended annual ophthalmology exams for early detection of glaucoma and other disorders of the eye. Is the patient up to date with their annual eye exam?  Yes  Who is the provider or what is the name of the office in which the patient attends annual eye exams? [facility] If pt is not established with a provider, would they like to be referred to a provider to establish care? No .   Dental Screening: Recommended annual dental exams for proper oral hygiene   Community Resource Referral / Chronic Care Management: CRR required this visit?  No   CCM required this visit?  No     Plan:     I have personally reviewed and noted the following in the patient's chart:   Medical and social history Use of alcohol, tobacco or illicit drugs  Current medications and supplements including opioid prescriptions.  Patient is not currently taking opioid prescriptions. Functional ability and status Nutritional status Physical activity Advanced directives List of other physicians Hospitalizations, surgeries, and ER visits in previous 12 months Vitals Screenings to include cognitive, depression, and falls Referrals and appointments  In addition, I have reviewed and discussed with patient certain preventive protocols, quality metrics, and best practice recommendations. A written personalized care plan for preventive services as well as general preventive health recommendations were provided to patient.     Sydell Axon, LPN   1/61/0960   After Visit Summary: (MyChart) Due to this being a telephonic visit, the after visit summary with patients personalized plan was offered to patient via MyChart   Nurse Notes: None

## 2023-06-26 ENCOUNTER — Ambulatory Visit (INDEPENDENT_AMBULATORY_CARE_PROVIDER_SITE_OTHER): Payer: 59 | Admitting: Nurse Practitioner

## 2023-06-26 ENCOUNTER — Encounter: Payer: Self-pay | Admitting: Nurse Practitioner

## 2023-06-26 VITALS — BP 122/78 | HR 98 | Temp 98.7°F | Ht 63.0 in | Wt 290.0 lb

## 2023-06-26 DIAGNOSIS — L853 Xerosis cutis: Secondary | ICD-10-CM | POA: Insufficient documentation

## 2023-06-26 DIAGNOSIS — L601 Onycholysis: Secondary | ICD-10-CM | POA: Diagnosis not present

## 2023-06-26 NOTE — Assessment & Plan Note (Signed)
Right great toenail detached from nailbed. Advised patient will likely fall off on its own. It is not painful. She would like evaluation by Podiatry for possible removal. Referral placed.

## 2023-06-26 NOTE — Assessment & Plan Note (Signed)
Bilateral feet. Not consistent with tinea pedis. It is not pruritic or painful. Occurring around heels. Likely dead and dry skin that is peeling off. Advised to continue using moisturizers/vaseline as needed. Will continue to monitor.

## 2023-06-26 NOTE — Progress Notes (Signed)
[name], NP-C Phone: [phone]  Shelia Chapman is a 47 y.o. female who presents today for foot problem.   Patient with skin peeling around her bilateral heels. She also believes she has a fungal infection on her right great toenail. It is not painful. Denies any injury to her toenail. She has been using lotions and vaseline on the bottoms of her feet to keep them moist.   Social History   Tobacco Use  Smoking Status Never  Smokeless Tobacco Never    Current Outpatient Medications on File Prior to Visit  Medication Sig Dispense Refill   baclofen (LIORESAL) 10 MG tablet Take 10 mg by mouth 3 (three) times daily as needed.     Cholecalciferol (VITAMIN D3) 1.25 MG (50000 UT) CAPS Take 1 capsule by mouth once a week 13 capsule 0   FEROSUL 325 (65 Fe) MG tablet Take 325 mg by mouth daily.     Fingolimod HCl 0.5 MG CAPS Take 0.5 mg by mouth. Take 1 tablet Mon, Wed, and Fr     gabapentin (NEURONTIN) 100 MG capsule 200 mg 2 (two) times daily. But taking M, W, F     leuprolide (LUPRON DEPOT, 49-MONTH,) 3.75 MG injection Inject 3.75 mg into the muscle every 28 (twenty-eight) days. 1 each 2   LUMIGAN 0.01 % SOLN 1 drop at bedtime.     metFORMIN (GLUCOPHAGE-XR) 500 MG 24 hr tablet 500 mg.     pravastatin (PRAVACHOL) 20 MG tablet Take 1 tablet (20 mg total) by mouth daily. After 6 pm 90 tablet 3   vitamin B-12 (CYANOCOBALAMIN) 1000 MCG tablet Take 1 tablet (1,000 mcg total) by mouth daily. 90 tablet 1   No current facility-administered medications on file prior to visit.    ROS see history of present illness  Objective  Physical Exam Vitals:   06/26/23 1259  BP: 122/78  Pulse: 98  Temp: 98.7 F (37.1 C)  SpO2: 95%    BP Readings from Last 3 Encounters:  06/26/23 122/78  04/29/23 130/80  03/31/23 119/71   Wt Readings from Last 3 Encounters:  06/26/23 290 lb (131.5 kg)  06/09/23 273 lb (123.8 kg)  04/29/23 283 lb 3.2 oz (128.5 kg)    Physical Exam Constitutional:       General: She is not in acute distress.    Appearance: Normal appearance.  HENT:     Head: Normocephalic.  Cardiovascular:     Rate and Rhythm: Normal rate and regular rhythm.     Heart sounds: Normal heart sounds.  Pulmonary:     Effort: Pulmonary effort is normal.     Breath sounds: Normal breath sounds.  Feet:     Right foot:     Skin integrity: Dry skin present.     Toenail Condition: Right toenails are abnormally thick. Fungal disease present.    Left foot:     Skin integrity: Dry skin present.     Comments: Right great toenail detached from nail bed.  Skin:    General: Skin is warm and dry.  Neurological:     General: No focal deficit present.     Mental Status: She is alert.  Psychiatric:        Mood and Affect: Mood normal.        Behavior: Behavior normal.     Assessment/Plan: Please see individual problem list.  Detachment of nail Assessment & Plan: Right great toenail detached from nailbed. Advised patient will likely fall off on its own. It  is not painful. She would like evaluation by Podiatry for possible removal. Referral placed.   Orders: -     Ambulatory referral to Podiatry  Dry skin dermatitis Assessment & Plan: Bilateral feet. Not consistent with tinea pedis. It is not pruritic or painful. Occurring around heels. Likely dead and dry skin that is peeling off. Advised to continue using moisturizers/vaseline as needed. Will continue to monitor.      Return if symptoms worsen or fail to improve.   [name], NP-C [facility] [employer]

## 2023-07-08 ENCOUNTER — Telehealth: Payer: Self-pay | Admitting: Obstetrics and Gynecology

## 2023-07-08 NOTE — Telephone Encounter (Signed)
Please let the patient know that the decision is hers. If she feels like she is being managed fairly well with her current medications, no further intervention is needed. If she still desires to proceed with a hysterectomy due to fibroids, then she can schedule an appointment to discuss surgery further.

## 2023-07-08 NOTE — Telephone Encounter (Signed)
Good Morning, Patient called this morning wanting to know her next steps.  She stated that she is not in any pain, and still taking her meds.    Please advise  CJ

## 2023-07-25 ENCOUNTER — Ambulatory Visit: Payer: 59 | Admitting: Podiatry

## 2023-10-30 ENCOUNTER — Ambulatory Visit: Payer: 59 | Admitting: Nurse Practitioner

## 2024-02-23 ENCOUNTER — Telehealth: Payer: Self-pay

## 2024-02-23 NOTE — Telephone Encounter (Signed)
 Called pt to see if we can get her scheduled for a follow up as she has not been seen since 2024 and she stated she moved and has new pcp

## 2024-03-18 ENCOUNTER — Other Ambulatory Visit: Payer: Self-pay | Admitting: Nurse Practitioner

## 2024-03-18 DIAGNOSIS — E785 Hyperlipidemia, unspecified: Secondary | ICD-10-CM
